# Patient Record
Sex: Female | Born: 1975 | Race: Black or African American | Hispanic: No | Marital: Single | Smoking: Never smoker
Health system: Southern US, Community
[De-identification: ages and names within clinical notes are randomized; demographics above are authoritative.]

## PROBLEM LIST (undated history)

## (undated) DIAGNOSIS — G919 Hydrocephalus, unspecified: Secondary | ICD-10-CM

## (undated) DIAGNOSIS — M6281 Muscle weakness (generalized): Secondary | ICD-10-CM

## (undated) DIAGNOSIS — G0490 Encephalitis and encephalomyelitis, unspecified: Secondary | ICD-10-CM

## (undated) DIAGNOSIS — I10 Essential (primary) hypertension: Secondary | ICD-10-CM

## (undated) DIAGNOSIS — F061 Catatonic disorder due to known physiological condition: Secondary | ICD-10-CM

## (undated) DIAGNOSIS — R569 Unspecified convulsions: Secondary | ICD-10-CM

## (undated) DIAGNOSIS — D649 Anemia, unspecified: Secondary | ICD-10-CM

## (undated) HISTORY — DX: Encephalitis and encephalomyelitis, unspecified: G04.90

## (undated) HISTORY — DX: Anemia, unspecified: D64.9

## (undated) HISTORY — DX: Catatonic disorder due to known physiological condition: F06.1

---
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
## 2004-06-25 ENCOUNTER — Emergency Department (HOSPITAL_COMMUNITY): Admission: EM | Admit: 2004-06-25 | Discharge: 2004-06-26 | Payer: Self-pay | Admitting: Emergency Medicine

## 2004-12-23 ENCOUNTER — Ambulatory Visit: Payer: Self-pay | Admitting: Obstetrics and Gynecology

## 2005-02-03 ENCOUNTER — Other Ambulatory Visit: Admission: RE | Admit: 2005-02-03 | Discharge: 2005-02-03 | Payer: Self-pay | Admitting: Obstetrics & Gynecology

## 2005-02-03 ENCOUNTER — Encounter (INDEPENDENT_AMBULATORY_CARE_PROVIDER_SITE_OTHER): Payer: Self-pay | Admitting: *Deleted

## 2005-02-03 ENCOUNTER — Ambulatory Visit: Payer: Self-pay | Admitting: Obstetrics & Gynecology

## 2005-02-24 ENCOUNTER — Ambulatory Visit: Payer: Self-pay | Admitting: Obstetrics & Gynecology

## 2005-07-31 ENCOUNTER — Encounter: Payer: Self-pay | Admitting: Family Medicine

## 2005-07-31 ENCOUNTER — Ambulatory Visit: Payer: Self-pay | Admitting: Family Medicine

## 2006-02-10 ENCOUNTER — Ambulatory Visit: Payer: Self-pay | Admitting: Family Medicine

## 2008-03-14 ENCOUNTER — Encounter: Admission: RE | Admit: 2008-03-14 | Discharge: 2008-03-14 | Payer: Self-pay | Admitting: Orthopaedic Surgery

## 2008-12-08 ENCOUNTER — Emergency Department (HOSPITAL_COMMUNITY): Admission: EM | Admit: 2008-12-08 | Discharge: 2008-12-08 | Payer: Self-pay | Admitting: Family Medicine

## 2009-06-23 ENCOUNTER — Ambulatory Visit: Payer: Self-pay | Admitting: Obstetrics and Gynecology

## 2009-06-23 ENCOUNTER — Inpatient Hospital Stay (HOSPITAL_COMMUNITY): Admission: AD | Admit: 2009-06-23 | Discharge: 2009-06-23 | Payer: Self-pay | Admitting: Obstetrics

## 2009-09-11 ENCOUNTER — Ambulatory Visit (HOSPITAL_COMMUNITY): Admission: RE | Admit: 2009-09-11 | Discharge: 2009-09-11 | Payer: Self-pay | Admitting: Obstetrics & Gynecology

## 2009-12-11 ENCOUNTER — Ambulatory Visit (HOSPITAL_COMMUNITY): Admission: RE | Admit: 2009-12-11 | Discharge: 2009-12-11 | Payer: Self-pay | Admitting: Obstetrics

## 2010-02-01 ENCOUNTER — Inpatient Hospital Stay (HOSPITAL_COMMUNITY): Admission: AD | Admit: 2010-02-01 | Discharge: 2010-02-01 | Payer: Self-pay | Admitting: Obstetrics & Gynecology

## 2010-02-01 ENCOUNTER — Inpatient Hospital Stay (HOSPITAL_COMMUNITY): Admission: AD | Admit: 2010-02-01 | Discharge: 2010-02-05 | Payer: Self-pay | Admitting: Obstetrics & Gynecology

## 2010-02-02 ENCOUNTER — Encounter: Payer: Self-pay | Admitting: Obstetrics & Gynecology

## 2010-02-06 ENCOUNTER — Inpatient Hospital Stay (HOSPITAL_COMMUNITY): Admission: AD | Admit: 2010-02-06 | Discharge: 2010-02-06 | Payer: Self-pay | Admitting: Obstetrics & Gynecology

## 2010-06-22 ENCOUNTER — Encounter: Payer: Self-pay | Admitting: Obstetrics & Gynecology

## 2010-08-14 LAB — CBC
Hemoglobin: 12.6 g/dL (ref 12.0–15.0)
MCHC: 33.8 g/dL (ref 30.0–36.0)
MCV: 99 fL (ref 78.0–100.0)
Platelets: 177 10*3/uL (ref 150–400)
Platelets: 249 10*3/uL (ref 150–400)
WBC: 16.2 10*3/uL — ABNORMAL HIGH (ref 4.0–10.5)

## 2010-08-14 LAB — URINALYSIS, ROUTINE W REFLEX MICROSCOPIC
Glucose, UA: NEGATIVE mg/dL
Protein, ur: NEGATIVE mg/dL
Specific Gravity, Urine: 1.015 (ref 1.005–1.030)
pH: 7.5 (ref 5.0–8.0)

## 2010-08-14 LAB — ABO/RH: ABO/RH(D): O POS

## 2010-08-14 LAB — URINE MICROSCOPIC-ADD ON

## 2010-08-14 LAB — RPR: RPR Ser Ql: NONREACTIVE

## 2010-08-18 LAB — URINALYSIS, ROUTINE W REFLEX MICROSCOPIC
Bilirubin Urine: NEGATIVE
Ketones, ur: NEGATIVE mg/dL
Leukocytes, UA: NEGATIVE
Nitrite: NEGATIVE
Protein, ur: NEGATIVE mg/dL
Urobilinogen, UA: 0.2 mg/dL (ref 0.0–1.0)
pH: 5.5 (ref 5.0–8.0)

## 2010-08-18 LAB — URINE MICROSCOPIC-ADD ON

## 2010-08-18 LAB — CBC: RDW: 13.3 % (ref 11.5–15.5)

## 2010-09-23 ENCOUNTER — Other Ambulatory Visit: Payer: Self-pay | Admitting: Surgery

## 2010-09-23 ENCOUNTER — Encounter (HOSPITAL_COMMUNITY): Payer: 59 | Attending: Surgery

## 2010-09-23 DIAGNOSIS — Z01812 Encounter for preprocedural laboratory examination: Secondary | ICD-10-CM | POA: Insufficient documentation

## 2010-09-23 DIAGNOSIS — M6200 Separation of muscle (nontraumatic), unspecified site: Secondary | ICD-10-CM | POA: Insufficient documentation

## 2010-09-23 DIAGNOSIS — Z01811 Encounter for preprocedural respiratory examination: Secondary | ICD-10-CM | POA: Insufficient documentation

## 2010-09-23 DIAGNOSIS — K429 Umbilical hernia without obstruction or gangrene: Secondary | ICD-10-CM | POA: Insufficient documentation

## 2010-09-23 DIAGNOSIS — Z79899 Other long term (current) drug therapy: Secondary | ICD-10-CM | POA: Insufficient documentation

## 2010-09-23 LAB — CBC
HCT: 38.5 % (ref 36.0–46.0)
Hemoglobin: 13 g/dL (ref 12.0–15.0)
MCV: 89.1 fL (ref 78.0–100.0)
Platelets: 341 10*3/uL (ref 150–400)

## 2010-09-23 LAB — SURGICAL PCR SCREEN
MRSA, PCR: NEGATIVE
Staphylococcus aureus: NEGATIVE

## 2010-09-23 LAB — HCG, SERUM, QUALITATIVE: Preg, Serum: NEGATIVE

## 2010-10-01 ENCOUNTER — Ambulatory Visit (HOSPITAL_COMMUNITY)
Admission: RE | Admit: 2010-10-01 | Discharge: 2010-10-01 | Disposition: A | Discharge status: Home / Self Care | Payer: 59 | Source: Ambulatory Visit | Attending: Surgery | Admitting: Surgery

## 2010-10-01 DIAGNOSIS — M6200 Separation of muscle (nontraumatic), unspecified site: Secondary | ICD-10-CM | POA: Insufficient documentation

## 2010-10-01 DIAGNOSIS — K439 Ventral hernia without obstruction or gangrene: Secondary | ICD-10-CM | POA: Insufficient documentation

## 2010-10-01 DIAGNOSIS — K429 Umbilical hernia without obstruction or gangrene: Secondary | ICD-10-CM | POA: Insufficient documentation

## 2010-10-14 NOTE — Op Note (Signed)
NAMESHARNEE, [name] NO.:  [id number]  MEDICAL RECORD NO.:  [id number]           PATIENT TYPE:  O  LOCATION:  DAYL                         FACILITY:  [facility]  PHYSICIAN:  Ardeth Sportsman, MD     DATE OF BIRTH:  [birth year]  DATE OF PROCEDURE:  10/01/2010 DATE OF DISCHARGE:  10/01/2010                              OPERATIVE REPORT   PREOPERATIVE DIAGNOSIS:  Periumbilical ventral hernia in a setting of diastasis recti.  POSTOPERATIVE DIAGNOSIS:  Periumbilical ventral hernia in a setting of diastasis recti.  PROCEDURE PERFORMED:  Laparoscopic plication of linea alba and primary ventral hernia repair with laparoscopic underlying mesh.  GYNECOLOGIST:  Roseanna Rainbow, M.D.  SURGEON:  Ardeth Sportsman, MD  ASSISTANT:  RN, first assistant.  ANESTHESIA: 1. General anesthesia. 2. Local anesthetic and field block. 3. On-Q continuous bupivacaine pain pump.  SPECIMENS:  None.  DRAINS:  None.  ESTIMATED BLOOD LOSS:  Minimal.  COMPLICATIONS:  None.  INDICATION:  Ms. [name] Greene is a 35 year old healthy female who was postpartum since September 2011, with significant diastasis recti and an increasing periumbilical ventral hernia.  She has had a little bit of thinning of the maceration.  She had been doing exercise, but it did not return.  Therefore, she was sent to me for surgical repair of the periumbilical ventral hernia.  Anatomy and physiology of abdominal formation was discussed. Pathophysiology of herniation with its natural history and risks were discussed.  Options were discussed.  Recommendation was made for diagnostic laparoscopy with repair of periumbilical ventral hernia.  In the face of diastasis recti, offered to try and plicate to help bring things together, although I did note the other options __________ reconstructive surgery by doing plication.  She wished to have consent with me.  Risk of recurrence, risks, benefits, and alternatives  were discussed.  Questions answered and she agreed to proceed.  OPERATIVE FINDINGS:  She had a 17 x 7 cm diastasis from the xiphoid down infraumbilically.  She had a 3 cm periumbilical ventral hernia defect. There were some omentum adherence to that region, but no severe adhesions.  There was no evidence of bowel obstruction.  DESCRIPTION OF PROCEDURE:  Informed consent was confirmed.  The patient underwent general anesthesia without any difficulty.  She received IV antibiotics just prior to incision.  She had sequential compression devices active during the case.  She had voided prior to coming to the operating room.  She was positioned supine with both arms tucked.  Her abdomen was prepped and draped in sterile fashion.  Surgical timeout confirmed our plan.  I attempted to place a 5-mm port in the left upper quadrant using optical entry technique.  However, her abdomen was little stretchy and doughy and while I get down to the peritoneum, I felt like I was having an increased pressure to get through there.  Therefore, I did a cut downon the umbilicus and placed down 5 mm port there.  I noted on inspection that the 5-mm port had gone into the preperitoneal space, but not into the peritoneal itself.  I advanced the left upper quadrant port  into the peritoneal cavity.  I did careful inspection and saw no injury to any intraabdominal structures.  I up-sized the periumbilical port to a 10-mm port.  I redirected the 5-mm port into the left lower quadrant through her prior Pfannenstiel incision.  I did inspection, noted the adhesions to the periumbilical ventral hernia and freed those off.  I measured out the defect.  I decided to try and plicate her diastasis.  I used a 0 Vicryl figure-of-8 stitch using a laparoscopic suture passer, interrupted x5 and made sure that I got fascial bites through the rectus fascia on both sides.  I desufflated the abdomen and tied these figure-of-8 stitches  down and they helped to close the diastasis down well.  I decided to reinforce this repair and also covered off the periumbilical ventral hernia using an underlay mesh.  I chose a 15 x 20 cm Parietex/Seprafilm dual-sided mesh.  I brought it into the abdomen and secured it to the anterior abdominal wall using interrupted #1 Prolene stitches x12.  This provided over 5 cm overlap across the diastasis and periumbilical ventral hernia.  I placed On-Q catheters preperitoneally under direct visualization.  I did inspection, hemostasis was good with no expanding hematoma, bowel injury or any other abnormalities.  I evacuated carbon dioxide and removed the ports. I closed the periumbilical ventral defect using interrupted Vicryl stitches since that was already exposed.  I closed the skin port site using Vicryl stitch, closed the puncture sites with transfascial stitch using Steri-Strips.  On-Q was secured.  The patient was extubated and sent to recovery room in stable condition. If her pain is adequately controlled and she is doing well, she can leave later today versus and needed to stay for longer periods if she has inadequate pain control.     Ardeth Sportsman, MD     SCG/MEDQ  D:  10/01/2010  T:  10/02/2010  Job:  [id number]  cc:   Roseanna Rainbow, M.D. Fax: [phone]  Electronically Signed by Karie Soda MD on 10/14/2010 12:06:26 PM

## 2010-10-17 NOTE — Group Therapy Note (Signed)
[name] Greene, [name] Greene.:  [id number]   MEDICAL RECORD NO.:  [id number]          PATIENT TYPE:  WOC   LOCATION:  [facility]                   FACILITY:  [facility]   PHYSICIAN:  Montey Hora, M.D.    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  She  has had one follow-up Pap in March 2007 that was normal and is here today  for her 80-month followup.  Denies any problems except for persistent vaginal  discharge.  She has been diagnosed with bacterial vaginosis in the past and  has been trying to use boric acid suppositories to keep them under control.  Continues to have occasional problems, however, with them, normally with a  watery vaginal discharge, not much itching involved.   Medicines, allergies, PMH all per previous.   PHYSICAL EXAMINATION:  VITAL SIGNS:  Temperature today is 98, pulse is 93,  blood pressure is 131/90, weight is 159.7.  GENERAL: In no acute distress.  ABDOMEN:  Soft and nontender.  GENITOURINARY:  Normal external genitalia.  Normal vaginal mucosa.  She has  a very thick, copious white discharge.  Cervix looks like it has had  previous surgery but is otherwise unremarkable.  Pap smear is sent and a wet  prep is sent.   ASSESSMENT AND PLAN:  1. History of H-G SIL with one previous normal Pap, will repeat today, and      if it is normal she can go back to yearly Pap smears.  2. Probable recurrent bacterial vaginosis.  Patient does not do well with      oral metronidazole, so desires a trial of clindamycin vaginal cream.      Prescription written for 2% cream per vagina q.h.s. x7, number quantity      sufficient with 3 refills.  Will determine followup when the Pap      returns.           ______________________________  Montey Hora,  M.D.     KR/MEDQ  D:  02/10/2006  T:  02/11/2006  Job:  [id number]

## 2010-10-17 NOTE — Group Therapy Note (Signed)
[name] Greene, [name] Greene NO.:  [id number]   MEDICAL RECORD NO.:  [id number]          PATIENT TYPE:  WOC   LOCATION:  [facility]                   FACILITY:  [facility]   PHYSICIAN:  Tinnie Gens, MD        DATE OF BIRTH:  1976/02/28   DATE OF SERVICE:                                    CLINIC NOTE   CHIEF COMPLAINT:  Followup Pap smear.   HISTORY OF PRESENT ILLNESS:  Patient is a 35 year old gravida 1, para 0-0-1-  0, who underwent LEEP in September of last year for a high-grade lesion on  an ECC.  The LEEP pathology is reviewed and only had CIN-1.  The patient for  a 29-month followup Pap.   The patient complains today of recurrent bacterial infection.  She has had a  lot recently.  She denies douching and she denies sit-down baths.   The patient also now has insurance and would like to have her birth control  refilled if possible.  She usually takes Lo Ovral and she has no  difficulties with this.   PHYSICAL EXAMINATION:  VITAL SIGNS:  Her vitals are as noted in the chart.  GENERAL: She is a well-developed, well-nourished black female in no acute  distress.  ABDOMEN:  Soft, nontender and nondistended.  GENITOURINARY:  Normal external female genitalia.  The vagina is pink and  rugated.  BUS was normal.  The uterus was small and anteverted.  The adnexa  were without mass or tenderness.  Scarring problem had previously been seen  on the cervix.   IMPRESSION:  1.  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  2.  Birth control consult.  3.  Recurrent bacterial vaginosis.   PLAN:  1.  A Pap smear today and again in 6 months.  If these are normal, she can      go back to yearly Paps.  2.  I have refilled Lo Ovral 1 p.o. daily for the next year.  3.  I have started boric acid capsules 600 mg intravaginally every other day      for the  first week and then once or twice weekly.  The patient will see      how this is doing.  If she has any further problems, she can follow up.      Otherwise, we will follow up in 6 months for a Pap.          ______________________________  Tinnie Gens, MD    TP/MEDQ  D:  07/31/2005  T:  08/01/2005  Job:  [id number]

## 2010-10-17 NOTE — Group Therapy Note (Signed)
[name] Greene, KOZUCH NO.:  [id number]   MEDICAL RECORD NO.:  [id number]          PATIENT TYPE:  WOC   LOCATION:  [facility]                   FACILITY:  [facility]   PHYSICIAN:  Elsie Lincoln, MD      DATE OF BIRTH:  Mar 30, 1976   DATE OF SERVICE:  02/03/2005                                    CLINIC NOTE   REASON FOR VISIT:  The patient underwent a LEEP for high-grade SIL on ECC  and unable to adequately visualize the transformation zone. The patient had  had questionably burning or freezing of the cervix in the past. 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  However, no acetowhite area could be visualized on the portio today. The  patient was very agitated and crying and she was soothed by the nurse.  Lidocaine 1% 10 mL with epinephrine solution was injected on the portio and  then a Fischer LEEP biopsy was attempted. However, I was having a large  problem burning at 12 o'clock with the LEEP so I started at 6 o'clock and  came to each side. I changed Fischer LEEPs twice and they were still unable  to cut through the top of the cervix. I changed to a regular U-shaped loop  and was able to get a sample of between 10 o'clock and 2 o'clock, so the  specimen is extremely fragmented and charred. However, at the end of the  procedure there was good hemostasis. I rollerballed the edges. The patient  did tolerate the procedure well. Monsel's solution was placed in the os.  There was no bleeding at the end of the procedure. The patient is to come  back in 2 weeks for results.           ______________________________  Elsie Lincoln, MD     KL/MEDQ  D:  02/03/2005  T:  02/03/2005  Job:  [id number]

## 2010-10-17 NOTE — Group Therapy Note (Signed)
NAME:  [name] Greene, [name] Greene   MEDICAL RECORD NO.:  [id number]          PATIENT TYPE:  WOC   LOCATION:  [facility]                   FACILITY:  [facility]   PHYSICIAN:  Argentina Donovan, MD        DATE OF BIRTH:  01-27-1976   DATE OF SERVICE:  12/23/2004                                    CLINIC NOTE   The patient is a 35 year old gravida 1, para 0-0-1-0 with a __________ who  [facility] an [facility] Pap [facility].  Had colposcopy which  showed mild dysplasia CIN I exocervix with a high-grade endocervical lesion.  This high-grade endocervical lesion could not be seen and therefore we are  going to treat the patient with a LEEP.  She seems to be a good candidate  for that in the clinic.  We have discussed the causes of dysplasia, the  pattern of the disease, and the procedure.  She has also watched the movie  on LEEP and we will schedule her for LEEP in the future.       PR/MEDQ  D:  12/23/2004  T:  12/24/2004  Job:  [id number]

## 2010-11-17 ENCOUNTER — Encounter (INDEPENDENT_AMBULATORY_CARE_PROVIDER_SITE_OTHER): Payer: Self-pay | Admitting: Surgery

## 2010-11-25 ENCOUNTER — Ambulatory Visit (INDEPENDENT_AMBULATORY_CARE_PROVIDER_SITE_OTHER): Payer: 59 | Admitting: Surgery

## 2010-11-25 ENCOUNTER — Encounter (INDEPENDENT_AMBULATORY_CARE_PROVIDER_SITE_OTHER): Payer: Self-pay | Admitting: Surgery

## 2010-11-25 DIAGNOSIS — M6208 Separation of muscle (nontraumatic), other site: Secondary | ICD-10-CM | POA: Insufficient documentation

## 2010-11-25 DIAGNOSIS — M6200 Separation of muscle (nontraumatic), unspecified site: Secondary | ICD-10-CM

## 2010-11-25 DIAGNOSIS — K439 Ventral hernia without obstruction or gangrene: Secondary | ICD-10-CM

## 2010-11-25 NOTE — Progress Notes (Signed)
Subjective:     Patient ID: [name] Greene, female   DOB: 03-20-1976, 35 y.o.   MRN: [id number]    There were no vitals taken for this visit.    HPI  Diagnosis:  periumbilical ventral hernia insetting of diastasis recti  Procedure performed:  plication of linea alba and primary ventral hernia repair with left underlying mesh on 09/2010  Reason for visit: Followup  The patient comes back feeling better.  She can sleep on her right side only. She does wake up with some soreness around RLQ. Her need for pain medications has markedly gone down. She's working on treadmill for 15 minutes of 4% grade. She is interested in more exercise. She is wanting to return to work.  She's had regular bowel movwements. She has occasional sweats. Chest swelling. She is wearing her binder.  Review of Systems  Gastrointestinal: Positive for abdominal pain. Negative for nausea, vomiting, diarrhea, constipation and abdominal distention.  Musculoskeletal: Negative for back pain and arthralgias.  All other systems reviewed and are negative.       Objective:   Physical Exam  Constitutional: She is oriented to person, place, and time. She appears well-developed and well-nourished. No distress.  HENT:  Head: Normocephalic.  Mouth/Throat: Oropharynx is clear and moist.  Neck: Normal range of motion. Neck supple.  Cardiovascular: Regular rhythm and intact distal pulses.   Pulmonary/Chest: Effort normal. No respiratory distress.  Abdominal: Soft. She exhibits no distension and no mass. There is no rebound and no guarding.       Mild bilateral flank soreness.  Much decreased.  Overweight w adipose.  Musculoskeletal: Normal range of motion.  Neurological: She is alert and oriented to person, place, and time.  Skin: Skin is warm and dry. She is not diaphoretic.  Psychiatric: She has a normal mood and affect. Her behavior is normal. Judgment and thought content normal.       Assessment:     7 weeks status  post plication of diastasis and repair of ventral hernia with underlay mesh    Plan:     Increase activity as tolerated. Noted when she can do a treadmill for 30 minutes then it is okay to start getting progressive activity. At this point because he's more than 6 weeks she can try more physical work. She is interested in trying to lose weight as she realizes that some of her abdominal distention she thought was really adipose tissue.  Okay to return to work. She manages standing with mld lifting.   Return to clinic p.r.n. I noted that the soreness she gradually stayed away and out of its intermittent and not constant but should help her. She felt reassured. She expressed appreciation.

## 2011-01-07 ENCOUNTER — Telehealth (INDEPENDENT_AMBULATORY_CARE_PROVIDER_SITE_OTHER): Payer: Self-pay | Admitting: Surgery

## 2011-01-07 NOTE — Telephone Encounter (Signed)
Returned pt's call [facility] notifying her that Dr Michaell Cowing said it was ok for her to get a colonic.Hulda Humphrey

## 2011-01-29 ENCOUNTER — Telehealth (INDEPENDENT_AMBULATORY_CARE_PROVIDER_SITE_OTHER): Payer: Self-pay

## 2011-01-29 NOTE — Telephone Encounter (Signed)
Called pt to notify her that I put her note in the mail today about ok to receive a colonic per Dr Michaell Cowing.Hulda Humphrey

## 2012-07-01 ENCOUNTER — Emergency Department (HOSPITAL_COMMUNITY)
Admission: EM | Admit: 2012-07-01 | Discharge: 2012-07-02 | Disposition: A | Discharge status: Skilled Nursing Facility | Payer: Medicaid Other | Attending: Emergency Medicine | Admitting: Emergency Medicine

## 2012-07-01 ENCOUNTER — Emergency Department (HOSPITAL_COMMUNITY): Payer: Medicaid Other

## 2012-07-01 DIAGNOSIS — E860 Dehydration: Secondary | ICD-10-CM | POA: Insufficient documentation

## 2012-07-01 DIAGNOSIS — G0481 Other encephalitis and encephalomyelitis: Secondary | ICD-10-CM | POA: Insufficient documentation

## 2012-07-01 DIAGNOSIS — N390 Urinary tract infection, site not specified: Secondary | ICD-10-CM | POA: Insufficient documentation

## 2012-07-01 DIAGNOSIS — R000 Tachycardia, unspecified: Secondary | ICD-10-CM | POA: Insufficient documentation

## 2012-07-01 DIAGNOSIS — R4182 Altered mental status, unspecified: Secondary | ICD-10-CM | POA: Insufficient documentation

## 2012-07-01 LAB — CBC WITH DIFFERENTIAL/PLATELET
Eosinophils Absolute: 0.1 10*3/uL (ref 0.0–0.7)
Eosinophils Relative: 1 % (ref 0–5)
Hemoglobin: 11.9 g/dL — ABNORMAL LOW (ref 12.0–15.0)
Lymphocytes Relative: 29 % (ref 12–46)
Lymphs Abs: 3.4 10*3/uL (ref 0.7–4.0)
MCH: 32 pg (ref 26.0–34.0)
Monocytes Absolute: 1 10*3/uL (ref 0.1–1.0)
Neutrophils Relative %: 62 % (ref 43–77)
Platelets: 419 10*3/uL — ABNORMAL HIGH (ref 150–400)
RDW: 15.4 % (ref 11.5–15.5)

## 2012-07-01 LAB — COMPREHENSIVE METABOLIC PANEL
Albumin: 3.5 g/dL (ref 3.5–5.2)
BUN: 16 mg/dL (ref 6–23)
CO2: 29 mEq/L (ref 19–32)
Calcium: 9.1 mg/dL (ref 8.4–10.5)
Chloride: 99 mEq/L (ref 96–112)
Creatinine, Ser: 0.9 mg/dL (ref 0.50–1.10)
GFR calc non Af Amer: 82 mL/min — ABNORMAL LOW (ref >90)
Potassium: 3.1 mEq/L — ABNORMAL LOW (ref 3.5–5.1)

## 2012-07-01 LAB — URINALYSIS, ROUTINE W REFLEX MICROSCOPIC
Nitrite: NEGATIVE
Specific Gravity, Urine: 1.023 (ref 1.005–1.030)
Urobilinogen, UA: 0.2 mg/dL (ref 0.0–1.0)
pH: 5.5 (ref 5.0–8.0)

## 2012-07-01 LAB — URINE MICROSCOPIC-ADD ON

## 2012-07-01 MED ORDER — CEPHALEXIN 500 MG PO CAPS: 500.0000 mg | ORAL_CAPSULE | Freq: Two times a day (BID) | ORAL | Status: DC

## 2012-07-01 MED ORDER — SODIUM CHLORIDE 0.9 % IV BOLUS (SEPSIS)
1000.0000 mL | Freq: Once | INTRAVENOUS | Status: AC
  Administered 2012-07-01: 1000 mL via INTRAVENOUS

## 2012-07-01 NOTE — ED Provider Notes (Signed)
History     CSN: [id number]  Arrival date & time 07/01/12  2036   First MD Initiated Contact with Patient 07/01/12 2045      Chief Complaint  Patient presents with  . Altered Mental Status    (Consider location/radiation/quality/duration/timing/severity/associated sxs/prior treatment) Patient is a 37 y.o. female presenting with shortness of breath. The history is provided by the patient.  Shortness of Breath  The current episode started today. The onset was sudden. The problem occurs frequently. The problem has been unchanged. The problem is mild. Nothing relieves the symptoms. Nothing aggravates the symptoms. Associated symptoms include shortness of breath. Pertinent negatives include no chest pain, no fever, no rhinorrhea and no wheezing. There was no intake of a foreign body. She has had intermittent steroid use. She has had prior hospitalizations. She has had prior ICU admissions. She has been fussy. Urine output has been normal. The last void occurred less than 6 hours ago. She has received no recent medical care.    No past medical history on file.  Past Surgical History  Procedure Date  . Foot surgery 1990's  . Cesarean section 02/02/10    No family history on file.  History  Substance Use Topics  . Smoking status: Never Smoker   . Smokeless tobacco: Not on file  . Alcohol Use: No    OB History    Grav Para Term Preterm Abortions TAB SAB Ect Mult Living                  Review of Systems  Constitutional: Negative for fever and fatigue.  HENT: Negative for congestion, rhinorrhea and postnasal drip.   Eyes: Negative for photophobia and visual disturbance.  Respiratory: Positive for shortness of breath. Negative for chest tightness and wheezing.   Cardiovascular: Negative for chest pain, palpitations and leg swelling.  Gastrointestinal: Negative for nausea, vomiting, abdominal pain and diarrhea.  Genitourinary: Negative for urgency, frequency and difficulty  urinating.  Musculoskeletal: Negative for back pain and arthralgias.  Skin: Negative for rash and wound.  Neurological: Negative for weakness and headaches.  Psychiatric/Behavioral: Positive for behavioral problems and confusion. Negative for agitation.    Allergies  Review of patient's allergies indicates no known allergies.  Home Medications   Current Outpatient Rx  Name  Route  Sig  Dispense  Refill  . ACETAMINOPHEN 650 MG RE SUPP   Rectal   Place 650 mg rectally every 4 (four) hours as needed. For tempeture         . VITAMIN D 1000 UNITS PO TABS   Oral   Take 1,000 Units by mouth daily.         Marland Kitchen ZOLPIDEM TARTRATE 10 MG PO TABS   Oral   Take 10 mg by mouth at bedtime as needed. For sleep         . CEPHALEXIN 500 MG PO CAPS   Oral   Take 1 capsule (500 mg total) by mouth 2 (two) times daily.   6 capsule   0     BP 115/75  Pulse 125  Temp 99.1 F (37.3 C) (Rectal)  Resp 20  SpO2 100%  Physical Exam  Nursing note and vitals reviewed. Constitutional: She appears well-developed and well-nourished. No distress.  HENT:  Head: Normocephalic and atraumatic.  Mouth/Throat: Oropharynx is clear and moist.  Eyes: EOM are normal. Pupils are equal, round, and reactive to light.  Neck: Normal range of motion. Neck supple.  Cardiovascular: Regular rhythm, normal heart sounds  and intact distal pulses.  Tachycardia present.   Pulmonary/Chest: Effort normal and breath sounds normal. She has no wheezes. She has no rales.  Abdominal: Soft. Bowel sounds are normal. She exhibits no distension. There is no tenderness. There is no rebound and no guarding.  Musculoskeletal: Normal range of motion. She exhibits no edema and no tenderness.  Lymphadenopathy:    She has no cervical adenopathy.  Neurological: She is alert. She displays normal reflexes. No cranial nerve deficit. She exhibits normal muscle tone. Coordination normal.       Eyes open but will not follow commands or  verbally respond (per nursing home this is normal when she is in a new environment)  Skin: Skin is warm. No rash noted. She is diaphoretic.  Psychiatric: She has a normal mood and affect. Her behavior is normal.    ED Course  Procedures (including critical care time)   Labs Reviewed  CBC WITH DIFFERENTIAL - Abnormal; Notable for the following:    WBC 11.7 (*)     RBC 3.72 (*)     Hemoglobin 11.9 (*)     HCT 35.0 (*)     Platelets 419 (*)     All other components within normal limits  COMPREHENSIVE METABOLIC PANEL - Abnormal; Notable for the following:    Potassium 3.1 (*)     Glucose, Bld 121 (*)     Total Bilirubin 0.2 (*)     GFR calc non Af Amer 82 (*)     All other components within normal limits  URINALYSIS, ROUTINE W REFLEX MICROSCOPIC - Abnormal; Notable for the following:    Hgb urine dipstick MODERATE (*)     Ketones, ur 15 (*)     Leukocytes, UA SMALL (*)     All other components within normal limits  URINE MICROSCOPIC-ADD ON - Abnormal; Notable for the following:    Squamous Epithelial / LPF FEW (*)     All other components within normal limits   Dg Chest Portable 1 View  07/01/2012  *RADIOLOGY REPORT*  Clinical Data: Altered mental status.  Non responsive.  PORTABLE CHEST - 1 VIEW  Comparison: None.  Findings: Thoracolumbar scoliosis.  Shallow inspiration.  Mild prominence of heart size may be due to technique.  Pulmonary vascularity is normal.  No focal airspace consolidation in the lungs.  No blunting of costophrenic angles.  No pneumothorax. Mediastinal contours appear intact.  IMPRESSION: No evidence of active pulmonary disease.   Original Report Authenticated By: Burman Nieves, M.D.      1. UTI (urinary tract infection)   2. Dehydration   3. 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 Pt was in her normal mental state and doing well for the majority of the day. Her family was visiting with her and prior to their departure, and she developed tachypnea (RR 40's), tachycardia HR 150, and hypertension. Her baseline mental status includes small conversation and ambulating but when she is in a new place she is completely non-verbal and will not follow commands. She is not oriented to person, place, or time. Does not feed herself and is incontinent. Nursing home reports that she is at her neurologic baseline but  will go in and out of catatonia at times today. On arrival to ED, pt was moaning and diaphoretic. HR 130's. Afebrile. Normotensive. Seemed to be at neurologic baseline. Given IVF and HR improved to 110.   11:09 PM Moaning stopped and patient appeared much more comfortable. Labs reveal slightly elevated WBC at 11,000. Normal renal function. Urine equivocal for infection. Will d/c with Keflex and await urine culture. Doubt serious bacterial illness at this time. Do not think she requires inpatient admission for further evaluation. Likely mild dehydration and UTI. Deemed stable for d/c back to nursing home with pcp f/u.        Johnnette Gourd, MD 07/01/12 2311

## 2012-07-01 NOTE — ED Provider Notes (Signed)
Medical screening examination/treatment/procedure(s) were conducted as a shared visit with non-physician practitioner(s) and myself.  I personally evaluated the patient during the encounter.  Seen for evaluation of agitation, possible shortness of breath. Patient quickly calmed down here without intervention and workup was negative. Discharged back to the skilled nursing facility.  Gilda Crease, MD 07/01/12 [phone]

## 2012-07-01 NOTE — ED Notes (Signed)
Per EMS- Patient from [facility] with Altered LOC. EMS originally called for hyperventilation.  Family on seen reports that patient would not let them touch her and began acting "different".  EMS also reports that patient started moaning on movement to truck.  Lung sounds are clear, no obvious issues with breathing.

## 2012-07-02 ENCOUNTER — Encounter (HOSPITAL_COMMUNITY): Payer: Self-pay

## 2012-07-02 ENCOUNTER — Emergency Department (HOSPITAL_COMMUNITY): Payer: Medicaid Other

## 2012-07-02 ENCOUNTER — Emergency Department (HOSPITAL_COMMUNITY)
Admission: EM | Admit: 2012-07-02 | Discharge: 2012-07-02 | Disposition: A | Discharge status: Home / Self Care | Payer: Medicaid Other | Attending: Emergency Medicine | Admitting: Emergency Medicine

## 2012-07-02 DIAGNOSIS — R292 Abnormal reflex: Secondary | ICD-10-CM | POA: Insufficient documentation

## 2012-07-02 DIAGNOSIS — Z79899 Other long term (current) drug therapy: Secondary | ICD-10-CM | POA: Insufficient documentation

## 2012-07-02 DIAGNOSIS — IMO0002 Reserved for concepts with insufficient information to code with codable children: Secondary | ICD-10-CM | POA: Insufficient documentation

## 2012-07-02 DIAGNOSIS — G911 Obstructive hydrocephalus: Secondary | ICD-10-CM | POA: Insufficient documentation

## 2012-07-02 DIAGNOSIS — Z931 Gastrostomy status: Secondary | ICD-10-CM | POA: Insufficient documentation

## 2012-07-02 DIAGNOSIS — I10 Essential (primary) hypertension: Secondary | ICD-10-CM | POA: Insufficient documentation

## 2012-07-02 DIAGNOSIS — M6281 Muscle weakness (generalized): Secondary | ICD-10-CM | POA: Insufficient documentation

## 2012-07-02 DIAGNOSIS — G40909 Epilepsy, unspecified, not intractable, without status epilepticus: Secondary | ICD-10-CM | POA: Insufficient documentation

## 2012-07-02 DIAGNOSIS — R569 Unspecified convulsions: Secondary | ICD-10-CM

## 2012-07-02 HISTORY — DX: Muscle weakness (generalized): M62.81

## 2012-07-02 HISTORY — DX: Hydrocephalus, unspecified: G91.9

## 2012-07-02 HISTORY — DX: Unspecified convulsions: R56.9

## 2012-07-02 HISTORY — DX: Essential (primary) hypertension: I10

## 2012-07-02 LAB — URINALYSIS, ROUTINE W REFLEX MICROSCOPIC
Nitrite: NEGATIVE
Protein, ur: NEGATIVE mg/dL
Urobilinogen, UA: 0.2 mg/dL (ref 0.0–1.0)

## 2012-07-02 LAB — BASIC METABOLIC PANEL
Calcium: 9.1 mg/dL (ref 8.4–10.5)
GFR calc Af Amer: >90 mL/min (ref >90)
GFR calc non Af Amer: >90 mL/min (ref >90)
Sodium: 139 mEq/L (ref 135–145)

## 2012-07-02 LAB — CBC WITH DIFFERENTIAL/PLATELET
Basophils Absolute: 0 10*3/uL (ref 0.0–0.1)
Eosinophils Absolute: 0 10*3/uL (ref 0.0–0.7)
Eosinophils Relative: 0 % (ref 0–5)
MCH: 30.9 pg (ref 26.0–34.0)
MCV: 93.5 fL (ref 78.0–100.0)
Neutrophils Relative %: 89 % — ABNORMAL HIGH (ref 43–77)
Platelets: 435 10*3/uL — ABNORMAL HIGH (ref 150–400)
RDW: 15.4 % (ref 11.5–15.5)
WBC: 8.7 10*3/uL (ref 4.0–10.5)

## 2012-07-02 LAB — URINE MICROSCOPIC-ADD ON

## 2012-07-02 LAB — HEPATIC FUNCTION PANEL
ALT: 7 U/L (ref 0–35)
Bilirubin, Direct: <0.1 mg/dL (ref 0.0–0.3)

## 2012-07-02 LAB — GLUCOSE, CAPILLARY: Glucose-Capillary: 116 mg/dL — ABNORMAL HIGH (ref 70–99)

## 2012-07-02 MED ORDER — LORAZEPAM 2 MG/ML IJ SOLN
INTRAMUSCULAR | Status: AC
  Filled 2012-07-02: qty 1

## 2012-07-02 MED ORDER — SODIUM CHLORIDE 0.9 % IV SOLN
1000.0000 mg | Freq: Once | INTRAVENOUS | Status: AC
  Administered 2012-07-02: 1000 mg via INTRAVENOUS
  Filled 2012-07-02: qty 10

## 2012-07-02 MED ORDER — LORAZEPAM 2 MG/ML IJ SOLN
1.0000 mg | Freq: Once | INTRAMUSCULAR | Status: AC
  Administered 2012-07-02: 1 mg via INTRAVENOUS

## 2012-07-02 MED ORDER — LEVETIRACETAM 500 MG PO TABS: 500.0000 mg | ORAL_TABLET | Freq: Two times a day (BID) | ORAL | Status: DC

## 2012-07-02 NOTE — ED Notes (Addendum)
Pt dx with encephalitis. Pt has hx encephalopathy, gen muscle weakness, abnormal gait, Cognitive communication deficit and symbolic dysfunction.  Per mom pt has hx of seizures x 1 year. Pt baseline is walking and answers questions. Pt was in a coma for 2 months recently but is getting back to her baseline.

## 2012-07-02 NOTE — ED Provider Notes (Signed)
History     CSN: [id number]  Arrival date & time 07/02/12  1357   First MD Initiated Contact with Patient 07/02/12 1522      Chief Complaint  Patient presents with  . Seizures  level 5 caveatpatient is noncommunicative. History is obtained the patient's mother and from the nurse[place]onte [place] at the skilled nursing facility. Patient  (Consider location/radiation/quality/duration/timing/severity/associated sxs/prior treatment) HPI Patient had a seizure this morning, generalized lasting approximately 5 minutes the patient has been noncommunicative since event. Mother reports the patient last talked and walked one week ago. No treatment prior to coming here. No other complaint Past Medical History  Diagnosis Date  . Hydrocephalus   . Muscle weakness (generalized)   . Seizures   . Hypertension    encephalitis  Past Surgical History  Procedure Date  . Foot surgery 1990's  . Cesarean section 02/02/10    No family history on file.  History  Substance Use Topics  . Smoking status: Never Smoker   . Smokeless tobacco: Not on file  . Alcohol Use: No    OB History    Grav Para Term Preterm Abortions TAB SAB Ect Mult Living                  Review of Systems  Unable to perform ROS: Other    Allergies  Review of patient's allergies indicates no known allergies.  Home Medications   Current Outpatient Rx  Name  Route  Sig  Dispense  Refill  . ACETAMINOPHEN 650 MG RE SUPP   Rectal   Place 650 mg rectally every 4 (four) hours as needed. For tempeture         . AMANTADINE HCL 100 MG PO CAPS   Oral   Take 100 mg by mouth 2 (two) times daily.         . AZATHIOPRINE 50 MG PO TABS   Oral   Take 50 mg by mouth daily.         Marland Kitchen VITAMIN D 1000 UNITS PO TABS   Oral   Take 1,000 Units by mouth daily.         Marland Kitchen DOCUSATE SODIUM 100 MG PO CAPS   Oral   Take 100 mg by mouth 2 (two) times daily.         Marland Kitchen ESOMEPRAZOLE MAGNESIUM 40 MG PO CPDR   Oral   Take 40  mg by mouth daily before breakfast.         . FAMOTIDINE 20 MG PO TABS   Oral   Take 20 mg by mouth.         . FERROUS GLUCONATE 325 MG PO TABS   Oral   Take 325 mg by mouth daily with breakfast.         . POLYETHYLENE GLYCOL 3350 PO PACK   Oral   Take 17 g by mouth daily.         Marland Kitchen PREDNISOLONE 15 MG/5ML PO SYRP   Oral   Take 20 mg by mouth daily.          . SENNOSIDES 8.6 MG PO TABS   Oral   Take 1 tablet by mouth daily.         Marland Kitchen ZOLPIDEM TARTRATE 10 MG PO TABS   Oral   Take 10 mg by mouth every 6 (six) hours. For sleep           BP 136/90  Pulse 100  Resp 14  SpO2 98%  Physical Exam  Nursing note and vitals reviewed. Constitutional:       Chronically ill-appearing  HENT:  Head: Normocephalic and atraumatic.  Eyes: Conjunctivae normal are normal. Pupils are equal, round, and reactive to light.  Neck: Neck supple. No tracheal deviation present. No thyromegaly present.  Cardiovascular: Normal rate and regular rhythm.   No murmur heard. Pulmonary/Chest: Effort normal and breath sounds normal.  Abdominal: Soft. Bowel sounds are normal. She exhibits no distension. There is no tenderness.       Gastrostomy tube in place  Musculoskeletal: Normal range of motion. She exhibits no edema and no tenderness.  Neurological: She is alert. She displays abnormal reflex. Coordination normal.       Withdrawal to noxious stimulus eyes open nonverbal spasticity of all 4 extremities left great toe upward going right toe downward going a phasic. tremulous  Skin: Skin is warm and dry. No rash noted.  Psychiatric: She has a normal mood and affect.    ED Course  Procedures (including critical care time)  Labs Reviewed  CBC WITH DIFFERENTIAL - Abnormal; Notable for the following:    RBC 3.82 (*)     Hemoglobin 11.8 (*)     HCT 35.7 (*)     Platelets 435 (*)     Neutrophils Relative 89 (*)     Lymphocytes Relative 10 (*)     Monocytes Relative 1 (*)     All other  components within normal limits  GLUCOSE, CAPILLARY - Abnormal; Notable for the following:    Glucose-Capillary 116 (*)     All other components within normal limits  BASIC METABOLIC PANEL   Dg Chest Portable 1 View  07/01/2012  *RADIOLOGY REPORT*  Clinical Data: Altered mental status.  Non responsive.  PORTABLE CHEST - 1 VIEW  Comparison: None.  Findings: Thoracolumbar scoliosis.  Shallow inspiration.  Mild prominence of heart size may be due to technique.  Pulmonary vascularity is normal.  No focal airspace consolidation in the lungs.  No blunting of costophrenic angles.  No pneumothorax. Mediastinal contours appear intact.  IMPRESSION: No evidence of active pulmonary disease.   Original Report Authenticated By: Burman Nieves, M.D.      No diagnosis found.  Date: 07/02/2012  Rate: 102  Rhythm: sinus tachycardia  QRS Axis: normal  Intervals: normal  ST/T Wave abnormalities: normal  Conduction Disutrbances:none  Narrative Interpretation:   Old EKG Reviewed: none available Results for orders placed during the hospital encounter of 07/02/12  CBC WITH DIFFERENTIAL      Component Value Range   WBC 8.7  4.0 - 10.5 K/uL   RBC 3.82 (*) 3.87 - 5.11 MIL/uL   Hemoglobin 11.8 (*) 12.0 - 15.0 g/dL   HCT 21.3 (*) 08.6 - 57.8 %   MCV 93.5  78.0 - 100.0 fL   MCH 30.9  26.0 - 34.0 pg   MCHC 33.1  30.0 - 36.0 g/dL   RDW 46.9  62.9 - 52.8 %   Platelets 435 (*) 150 - 400 K/uL   Neutrophils Relative 89 (*) 43 - 77 %   Neutro Abs 7.7  1.7 - 7.7 K/uL   Lymphocytes Relative 10 (*) 12 - 46 %   Lymphs Abs 0.8  0.7 - 4.0 K/uL   Monocytes Relative 1 (*) 3 - 12 %   Monocytes Absolute 0.1  0.1 - 1.0 K/uL   Eosinophils Relative 0  0 - 5 %   Eosinophils Absolute 0.0  0.0 - 0.7 K/uL  Basophils Relative 0  0 - 1 %   Basophils Absolute 0.0  0.0 - 0.1 K/uL  BASIC METABOLIC PANEL      Component Value Range   Sodium 139  135 - 145 mEq/L   Potassium 3.4 (*) 3.5 - 5.1 mEq/L   Chloride 102  96 - 112 mEq/L    CO2 28  19 - 32 mEq/L   Glucose, Bld 113 (*) 70 - 99 mg/dL   BUN 9  6 - 23 mg/dL   Creatinine, Ser 6.29  0.50 - 1.10 mg/dL   Calcium 9.1  8.4 - 52.8 mg/dL   GFR calc non Af Amer >90  >90 mL/min   GFR calc Af Amer >90  >90 mL/min  GLUCOSE, CAPILLARY      Component Value Range   Glucose-Capillary 116 (*) 70 - 99 mg/dL  URINALYSIS, ROUTINE W REFLEX MICROSCOPIC      Component Value Range   Color, Urine YELLOW  YELLOW   APPearance CLEAR  CLEAR   Specific Gravity, Urine 1.013  1.005 - 1.030   pH 7.0  5.0 - 8.0   Glucose, UA NEGATIVE  NEGATIVE mg/dL   Hgb urine dipstick MODERATE (*) NEGATIVE   Bilirubin Urine NEGATIVE  NEGATIVE   Ketones, ur TRACE (*) NEGATIVE mg/dL   Protein, ur NEGATIVE  NEGATIVE mg/dL   Urobilinogen, UA 0.2  0.0 - 1.0 mg/dL   Nitrite NEGATIVE  NEGATIVE   Leukocytes, UA MODERATE (*) NEGATIVE  HEPATIC FUNCTION PANEL      Component Value Range   Total Protein 6.7  6.0 - 8.3 g/dL   Albumin 3.5  3.5 - 5.2 g/dL   AST 14  0 - 37 U/L   ALT 7  0 - 35 U/L   Alkaline Phosphatase 72  39 - 117 U/L   Total Bilirubin 0.3  0.3 - 1.2 mg/dL   Bilirubin, Direct <4.1  0.0 - 0.3 mg/dL   Indirect Bilirubin NOT CALCULATED  0.3 - 0.9 mg/dL  URINE MICROSCOPIC-ADD ON      Component Value Range   Squamous Epithelial / LPF RARE  RARE   WBC, UA 0-2  <3 WBC/hpf   RBC / HPF 0-2  <3 RBC/hpf   Bacteria, UA RARE  RARE   Urine-Other LESS THAN 10 mL OF URINE SUBMITTED     Ct Head Wo Contrast  07/02/2012  *RADIOLOGY REPORT*  Clinical Data: Seizures.  Encephalitis.  CT HEAD WITHOUT CONTRAST  Technique:  Contiguous axial images were obtained from the base of the skull through the vertex without contrast.  Comparison: None.  Findings: Hypoattenuation adjacent to the frontal horns of the lateral ventricles and within the anterior limb of the internal capsule bilaterally is likely the result of previous ischemic injury.  No acute cortical infarct, hemorrhage, mass lesion is present.  No definite  heterotopia is identified.  The ventricles are of normal size.  No significant extra-axial fluid collection is present.  The paranasal sinuses and mastoid air cells are clear.  The osseous skull is intact.  IMPRESSION:  1.  Periventricular white matter changes likely reflect remote ischemia. 2.  No acute intracranial abnormality or focal lesion to explain seizures.   Original Report Authenticated By: Marin Roberts, M.D.    Dg Chest Portable 1 View  07/01/2012  *RADIOLOGY REPORT*  Clinical Data: Altered mental status.  Non responsive.  PORTABLE CHEST - 1 VIEW  Comparison: None.  Findings: Thoracolumbar scoliosis.  Shallow inspiration.  Mild prominence of heart size  may be due to technique.  Pulmonary vascularity is normal.  No focal airspace consolidation in the lungs.  No blunting of costophrenic angles.  No pneumothorax. Mediastinal contours appear intact.  IMPRESSION: No evidence of active pulmonary disease.   Original Report Authenticated By: Burman Nieves, M.D.     Patient medicated with Ativan 2 mg IV which stopped tremors. 8:40 PM patient is alert moves all extremities. Appears relaxed. Looks at her baseline per her mother MDM  I'm concerned for status epilepticus Neurology consult called to see patient in ED. seizure felt secondary to past encephalitis  Dr.Camilo, hospitalist neurologist evaluate patient in the emergency department feels that she suffered from seizure with prolonged postictal phase. He suggests Keppra 1 g IV load prior to discharge prescription for keppra 500 milligrams twice a day and followup with [facility].  Diagnosis seizure     Doug Sou, MD 07/02/12 2048

## 2012-07-02 NOTE — ED Notes (Signed)
Neurologist at bedside. 

## 2012-07-02 NOTE — ED Notes (Signed)
Family at bedside. 

## 2012-07-02 NOTE — ED Notes (Signed)
EMS called out to [facility] for pt with seizures. Home states it was a grand mal. Pt had hands crossed across chest and clinched up and staring.

## 2012-07-02 NOTE — Consult Note (Signed)
NEURO HOSPITALIST CONSULT NOTE    Reason for Consult:new onset seizure, non communicative for hours.   HPI:                                                                                                                                          [name] Greene is an 37 y.o. 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 According to nursing staff at that facility, she had continuous seizure activity for approximately 5 minutes. She remained non communicative in the ED for several hours but at the time of my assessment she is answering questions appropriately. Patient's family tells me that she never had seizures before and that she was a quite normal person until the diagnosis of encephalitis last year. Received 2 mg iv ativan in the ED and no further clinical seizures ever since. Offers no complains.   Past Medical History  Diagnosis Date  . Hydrocephalus   . Muscle weakness (generalized)   . Seizures   . Hypertension     Past Surgical History  Procedure Date  . Foot surgery 1990's  . Cesarean section 02/02/10    No family history on file.  Family History: no family history of epilepsy.  Social History:  reports that she has never smoked. She does not have any smokeless tobacco history on file. She reports that she does not drink alcohol. Her drug history not on file.  No Known Allergies  MEDICATIONS:                                                                                                                     I have reviewed the patient's current medications.   ROS: unable to obtain reliably.  History obtained from chart review and  patient's family.   Physical exam: Pleasant female in no apparent distress. Blood pressure 133/99, pulse 100, temperature 98.7 F (37.1 C), temperature source Rectal, resp. rate 21, SpO2 98.00%. Head: normocephalic. Neck: supple. Cardiac: no murmurs. Lungs: clear. Abdomen: soft. Extremities: symmetrical without edema.  Neurologic Examination:                                                                                                      Mental status: alert, awake, oriented to place and name. Follows 3rd order commands without difficulty. Naming and repetition are appropriate. No obvious dysphasia or dysarthria that I can appreciate. CN 2-12: pupils 4 mm bilaterally, reactive to light. No papilledema. No gaze preference. EOM full without nystagmus. Face is symmetric. Tongue is midline.  Motor: seems to be able to move all limbs symmetrically. Sensory: unreliable. DTR's: generalized hyperreflexia, more conspicuous in the left. Plantars: left upgoing, right downgoing. Coordination and gait: untested.   No results found for this basename: cbc, bmp, coags, chol, tri, ldl, hga1c    Results for orders placed during the hospital encounter of 07/02/12 (from the past 48 hour(s))  CBC WITH DIFFERENTIAL     Status: Abnormal   Collection Time   07/02/12  2:22 PM      Component Value Range Comment   WBC 8.7  4.0 - 10.5 K/uL    RBC 3.82 (*) 3.87 - 5.11 MIL/uL    Hemoglobin 11.8 (*) 12.0 - 15.0 g/dL    HCT 21.3 (*) 08.6 - 46.0 %    MCV 93.5  78.0 - 100.0 fL    MCH 30.9  26.0 - 34.0 pg    MCHC 33.1  30.0 - 36.0 g/dL    RDW 57.8  46.9 - 62.9 %    Platelets 435 (*) 150 - 400 K/uL    Neutrophils Relative 89 (*) 43 - 77 %    Neutro Abs 7.7  1.7 - 7.7 K/uL    Lymphocytes Relative 10 (*) 12 - 46 %    Lymphs Abs 0.8  0.7 - 4.0 K/uL    Monocytes Relative 1 (*) 3 - 12 %    Monocytes Absolute 0.1  0.1 - 1.0 K/uL    Eosinophils Relative 0  0 - 5 %    Eosinophils Absolute 0.0  0.0 - 0.7 K/uL     Basophils Relative 0  0 - 1 %    Basophils Absolute 0.0  0.0 - 0.1 K/uL   BASIC METABOLIC PANEL     Status: Abnormal   Collection Time   07/02/12  2:22 PM      Component Value Range Comment   Sodium 139  135 - 145 mEq/L    Potassium 3.4 (*) 3.5 - 5.1 mEq/L    Chloride 102  96 - 112 mEq/L    CO2 28  19 - 32 mEq/L    Glucose, Bld 113 (*) 70 - 99 mg/dL    BUN 9  6 - 23 mg/dL    Creatinine, Ser 5.28  0.50 -  1.10 mg/dL    Calcium 9.1  8.4 - 16.1 mg/dL    GFR calc non Af Amer >90  >90 mL/min    GFR calc Af Amer >90  >90 mL/min   HEPATIC FUNCTION PANEL     Status: Normal   Collection Time   07/02/12  2:22 PM      Component Value Range Comment   Total Protein 6.7  6.0 - 8.3 g/dL    Albumin 3.5  3.5 - 5.2 g/dL    AST 14  0 - 37 U/L    ALT 7  0 - 35 U/L    Alkaline Phosphatase 72  39 - 117 U/L    Total Bilirubin 0.3  0.3 - 1.2 mg/dL    Bilirubin, Direct <0.9  0.0 - 0.3 mg/dL REPEATED TO VERIFY   Indirect Bilirubin NOT CALCULATED  0.3 - 0.9 mg/dL   GLUCOSE, CAPILLARY     Status: Abnormal   Collection Time   07/02/12  2:46 PM      Component Value Range Comment   Glucose-Capillary 116 (*) 70 - 99 mg/dL   URINALYSIS, ROUTINE W REFLEX MICROSCOPIC     Status: Abnormal   Collection Time   07/02/12  4:46 PM      Component Value Range Comment   Color, Urine YELLOW  YELLOW    APPearance CLEAR  CLEAR    Specific Gravity, Urine 1.013  1.005 - 1.030    pH 7.0  5.0 - 8.0    Glucose, UA NEGATIVE  NEGATIVE mg/dL    Hgb urine dipstick MODERATE (*) NEGATIVE    Bilirubin Urine NEGATIVE  NEGATIVE    Ketones, ur TRACE (*) NEGATIVE mg/dL    Protein, ur NEGATIVE  NEGATIVE mg/dL    Urobilinogen, UA 0.2  0.0 - 1.0 mg/dL    Nitrite NEGATIVE  NEGATIVE    Leukocytes, UA MODERATE (*) NEGATIVE   URINE MICROSCOPIC-ADD ON     Status: Normal   Collection Time   07/02/12  4:46 PM      Component Value Range Comment   Squamous Epithelial / LPF RARE  RARE    WBC, UA 0-2  <3 WBC/hpf    RBC / HPF 0-2  <3 RBC/hpf     Bacteria, UA RARE  RARE    Urine-Other LESS THAN 10 mL OF URINE SUBMITTED   MICROSCOPIC EXAM PERFORMED ON UNCONCENTRATED URINE    Ct Head Wo Contrast  07/02/2012  *RADIOLOGY REPORT*  Clinical Data: Seizures.  Encephalitis.  CT HEAD WITHOUT CONTRAST  Technique:  Contiguous axial images were obtained from the base of the skull through the vertex without contrast.  Comparison: None.  Findings: Hypoattenuation adjacent to the frontal horns of the lateral ventricles and within the anterior limb of the internal capsule bilaterally is likely the result of previous ischemic injury.  No acute cortical infarct, hemorrhage, mass lesion is present.  No definite heterotopia is identified.  The ventricles are of normal size.  No significant extra-axial fluid collection is present.  The paranasal sinuses and mastoid air cells are clear.  The osseous skull is intact.  IMPRESSION:  1.  Periventricular white matter changes likely reflect remote ischemia. 2.  No acute intracranial abnormality or focal lesion to explain seizures.   Original Report Authenticated By: Marin Roberts, M.D.    Dg Chest Portable 1 View  07/01/2012  *RADIOLOGY REPORT*  Clinical Data: Altered mental status.  Non responsive.  PORTABLE CHEST - 1 VIEW  Comparison: None.  Findings: Thoracolumbar scoliosis.  Shallow inspiration.  Mild prominence of heart size may be due to technique.  Pulmonary vascularity is normal.  No focal airspace consolidation in the lungs.  No blunting of costophrenic angles.  No pneumothorax. Mediastinal contours appear intact.  IMPRESSION: No evidence of active pulmonary disease.   Original Report Authenticated By: Burman Nieves, M.D.      Assessment/Plan: New onset seizure in an unfortunate 37 years old female with disabling autoimmune encephalitis. She is doing much better at this moment, and I believe she most likely had a prolonged postictal state. However, her underlying encephalitis can predispose her to have  further seizures and thus will suggest loading her with 1 gram keppra now and initiating chronic anti-seizure treatment with keppra 500 mg BID. Needs outpatient neurology follow up.  Ermalene Postin  [facility] [phone]  07/02/2012, 6:43 PM

## 2012-07-02 NOTE — ED Notes (Signed)
Patient is alert with minimal response.  This is her baseline per family.  Family was given DC instructions and MD follow up.  They gave verbal understanding.  V/S stable.  She was not showing any signs of distress on DC

## 2012-07-02 NOTE — ED Notes (Signed)
[id number]<WG> Expected date:<BR> Expected time:<BR> Means of arrival:<BR> Comments:<BR> Seizures, from nursing home

## 2012-08-02 ENCOUNTER — Emergency Department (HOSPITAL_COMMUNITY)
Admission: EM | Admit: 2012-08-02 | Discharge: 2012-08-02 | Disposition: A | Discharge status: Skilled Nursing Facility | Payer: Medicaid Other | Attending: Emergency Medicine | Admitting: Emergency Medicine

## 2012-08-02 DIAGNOSIS — K9423 Gastrostomy malfunction: Secondary | ICD-10-CM | POA: Insufficient documentation

## 2012-08-02 DIAGNOSIS — IMO0002 Reserved for concepts with insufficient information to code with codable children: Secondary | ICD-10-CM | POA: Insufficient documentation

## 2012-08-02 DIAGNOSIS — Z8669 Personal history of other diseases of the nervous system and sense organs: Secondary | ICD-10-CM | POA: Insufficient documentation

## 2012-08-02 DIAGNOSIS — G40909 Epilepsy, unspecified, not intractable, without status epilepticus: Secondary | ICD-10-CM | POA: Insufficient documentation

## 2012-08-02 DIAGNOSIS — Z79899 Other long term (current) drug therapy: Secondary | ICD-10-CM | POA: Insufficient documentation

## 2012-08-02 DIAGNOSIS — I10 Essential (primary) hypertension: Secondary | ICD-10-CM | POA: Insufficient documentation

## 2012-08-02 NOTE — ED Provider Notes (Addendum)
History     CSN: [id number]  Arrival date & time 08/02/12  0012   First MD Initiated Contact with Patient 08/02/12 0016      Chief Complaint  Patient presents with  . Peg Tube Larey Seat Out     (Consider location/radiation/quality/duration/timing/severity/associated sxs/prior treatment) HPI Level V caveat: Noncommunicative. This is a 37 year old female who is the resident of a nursing facility. 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 She is not currently in a catatonic state. She was sent to the ED because her PEG tube reportedly fell out "one hour ago". There is no associated pain, bleeding or drainage at the PEG site. She has not shown any evidence of distress.   Past Medical History  Diagnosis Date  . Hydrocephalus   . Muscle weakness (generalized)   . Seizures   . Hypertension     Past Surgical History  Procedure Laterality Date  . Foot surgery  1990's  . Cesarean section  02/02/10    No family history on file.  History  Substance Use Topics  . Smoking status: Never Smoker   . Smokeless tobacco: Not on file  . Alcohol Use: No    OB History   Grav Para Term Preterm Abortions TAB SAB Ect Mult Living                  Review of Systems  Unable to perform ROS   Allergies  Review of patient's allergies indicates no known allergies.  Home Medications   Current Outpatient Rx  Name  Route  Sig  Dispense  Refill  . acetaminophen (TYLENOL) 650 MG suppository   Rectal   Place 650 mg rectally every 4 (four) hours as needed. For tempeture         . amantadine (SYMMETREL) 100 MG capsule   Oral   Take 100 mg by mouth 2 (two) times daily.         Marland Kitchen azaTHIOprine (IMURAN) 50 MG tablet   Oral   Take 50 mg by mouth daily.         . cholecalciferol (VITAMIN D) 1000 UNITS tablet   Oral   Take 1,000 Units by mouth daily.         Marland Kitchen docusate sodium (COLACE) 100 MG capsule   Oral   Take 100 mg by mouth 2 (two)  times daily.         Marland Kitchen esomeprazole (NEXIUM) 40 MG capsule   Oral   Take 40 mg by mouth daily before breakfast.         . famotidine (PEPCID) 20 MG tablet   Oral   Take 20 mg by mouth.         . ferrous gluconate (FERGON) 325 MG tablet   Oral   Take 325 mg by mouth daily with breakfast.         . levETIRAcetam (KEPPRA) 500 MG tablet   Oral   Take 1 tablet (500 mg total) by mouth every 12 (twelve) hours.   60 tablet   0   . polyethylene glycol (MIRALAX / GLYCOLAX) packet   Oral   Take 17 g by mouth daily.         . prednisoLONE (PRELONE) 15 MG/5ML syrup   Oral   Take 20 mg by mouth daily.          Marland Kitchen senna (SENOKOT) 8.6 MG tablet   Oral   Take 1 tablet by mouth daily.         Marland Kitchen  zolpidem (AMBIEN) 10 MG tablet   Oral   Take 10 mg by mouth every 6 (six) hours. For sleep           BP 132/83  Pulse 94  Temp(Src) 98 F (36.7 C) (Oral)  Resp 14  SpO2 98%  Physical Exam General: Well-developed, well-nourished female in no acute distress; appearance consistent with age of record HENT: normocephalic, atraumatic; large hairy nevus of right mandible Eyes: pupils equal round and reactive to light; extraocular muscles grossly intact Neck: supple Heart: regular rate and rhythm Lungs: clear to auscultation bilaterally Abdomen: soft; distention due to abdominal wall weakness; nontender; bowel sounds present; PEG tube site nearly completely healed closed, no granulation tissue seen, no tenderness is present, no bleeding or drainage present, stoma patent only to the lubricated wooden handle (about 2mm diameter) of a sterile cotton swab Extremities: No deformity; full range of motion; pulses normal Neurologic: Awake, alert, nonverbal; motor function intact in all extremities and symmetric; no facial droop Skin: Warm and dry    ED Course  Procedures (including critical care time)   MDM  As the patient is currently eating and drinking and is not in a catatonic  state we will discharge the patient back to her nursing facility. We will advise them that they may have her PEG tube replaced on schedule outpatient basis. Examination is consistent with the PEG tube having fallen out days or weeks ago.  Although patient is unable to express herself verbally, when asked by staff if her PEG tube had been out for several days she was able to nod in the affirmative.        Hanley Seamen, MD 08/02/12 0034  Hanley Seamen, MD 08/02/12 1610  Hanley Seamen, MD 08/02/12 [phone]

## 2012-08-02 NOTE — ED Notes (Signed)
[name] Greene from [facility] was contacted. Pt had 16 F peg tube. Pt pulled out peg tube at aprox 2300 on 08/01/12 stated by [facility] staff.  Tube sight dry no drainage. Pt does not guard area.

## 2012-08-02 NOTE — ED Notes (Signed)
Pt sent from [facility] to  ED for peg tube  To be replaced

## 2012-08-02 NOTE — Discharge Instructions (Signed)
Your PEG tube site has healed closed due to the prolonged absence of the tube. You will need to have a new patient tube placed by an interventional radiologist or gastroenterologist as an outpatient.

## 2012-09-27 ENCOUNTER — Non-Acute Institutional Stay (SKILLED_NURSING_FACILITY): Payer: 59 | Admitting: Internal Medicine

## 2012-09-27 DIAGNOSIS — R569 Unspecified convulsions: Secondary | ICD-10-CM

## 2012-09-27 DIAGNOSIS — G0490 Encephalitis and encephalomyelitis, unspecified: Secondary | ICD-10-CM

## 2012-09-27 DIAGNOSIS — F061 Catatonic disorder due to known physiological condition: Secondary | ICD-10-CM

## 2012-09-27 DIAGNOSIS — R29818 Other symptoms and signs involving the nervous system: Secondary | ICD-10-CM

## 2012-09-27 DIAGNOSIS — E870 Hyperosmolality and hypernatremia: Secondary | ICD-10-CM

## 2012-09-27 NOTE — Progress Notes (Signed)
Patient ID: [name] Greene, female   DOB: Oct 09, 1975, 37 y.o.   MRN: [id number] Chief complaint ; review of recent medication changes, question sedation.  History; this is a 37 year old woman, who we admitted to the facility in December 2013. She is asked to spent an extremely complex stay at [facility] ultimately diagnosed with limbic encephalitis. She was also seen by psychiatry for depression and apparent apparent catatonia. She came here on a complex medical regimen, which included azathioprine, prednisone at 60 mg, and Ambien 10 mg every 6 hours routinely used as "off label" treatment for catatonia. She has recently been back to see her neurologist at [facility] and he has outlined a tapering course of prednisone to 40 mg. The azathioprine has been increased. Lab work was done at the physician's office. She was also noted to have hypernatremia last checked here on March 10 at 148.   The patient is asked me some good improvement. Her PEG tube was removed in March. She is eating and drinking on her own. She is able to verbalize, talks to her mother on the phone. Staff report that she actually seems somewhat sedated with the Ambien. I don't believe she has ever had any psychiatric followup at [facility].  Past medical history this is reviewed.  Medication list also reviewed.  Physical exam. Gen. no acute distress. [name]. Respiratory clear bilaterally. Cardiac heart sounds are normal. No murmurs. Musculoskeletal she continues to have her extension at the PIP of both hands. I wonder some point if she had active synovitis, although she has not had any currently  Impression/plan #1 limbic encephalitis . This actually has made some decent improvement and she has been to see her neurologist with increasing dose of azathioprine to 150 mg a day and a taper of the prednisone initiated. #2 catatonia. Again, I really have never really witnessed this. I think we would be reasonable to  taper this down. I don't believe she has any psychiatric follow up. #3 hypernatremia. This will need to be repeated.

## 2012-09-30 ENCOUNTER — Non-Acute Institutional Stay (SKILLED_NURSING_FACILITY): Payer: Medicaid Other | Admitting: Adult Health

## 2012-09-30 DIAGNOSIS — K5900 Constipation, unspecified: Secondary | ICD-10-CM

## 2012-09-30 DIAGNOSIS — G0491 Myelitis, unspecified: Secondary | ICD-10-CM

## 2012-09-30 DIAGNOSIS — D649 Anemia, unspecified: Secondary | ICD-10-CM

## 2012-09-30 DIAGNOSIS — F061 Catatonic disorder due to known physiological condition: Secondary | ICD-10-CM

## 2012-09-30 DIAGNOSIS — F202 Catatonic schizophrenia: Secondary | ICD-10-CM

## 2012-09-30 DIAGNOSIS — R29818 Other symptoms and signs involving the nervous system: Secondary | ICD-10-CM

## 2012-09-30 DIAGNOSIS — R569 Unspecified convulsions: Secondary | ICD-10-CM

## 2012-09-30 DIAGNOSIS — G0490 Encephalitis and encephalomyelitis, unspecified: Secondary | ICD-10-CM

## 2012-10-20 ENCOUNTER — Emergency Department (HOSPITAL_COMMUNITY): Payer: Medicaid Other

## 2012-10-20 ENCOUNTER — Encounter (HOSPITAL_COMMUNITY): Payer: Self-pay | Admitting: *Deleted

## 2012-10-20 ENCOUNTER — Inpatient Hospital Stay (HOSPITAL_COMMUNITY)
Admission: EM | Admit: 2012-10-20 | Discharge: 2012-10-20 | DRG: 100 | Disposition: A | Discharge status: Home / Self Care | Payer: Medicaid Other | Attending: Internal Medicine | Admitting: Internal Medicine

## 2012-10-20 DIAGNOSIS — G0490 Encephalitis and encephalomyelitis, unspecified: Secondary | ICD-10-CM | POA: Diagnosis present

## 2012-10-20 DIAGNOSIS — R000 Tachycardia, unspecified: Secondary | ICD-10-CM | POA: Diagnosis present

## 2012-10-20 DIAGNOSIS — I10 Essential (primary) hypertension: Secondary | ICD-10-CM | POA: Diagnosis present

## 2012-10-20 DIAGNOSIS — M6281 Muscle weakness (generalized): Secondary | ICD-10-CM | POA: Diagnosis present

## 2012-10-20 DIAGNOSIS — G4089 Other seizures: Secondary | ICD-10-CM

## 2012-10-20 DIAGNOSIS — R569 Unspecified convulsions: Secondary | ICD-10-CM

## 2012-10-20 DIAGNOSIS — G40802 Other epilepsy, not intractable, without status epilepticus: Principal | ICD-10-CM | POA: Diagnosis present

## 2012-10-20 DIAGNOSIS — Z79899 Other long term (current) drug therapy: Secondary | ICD-10-CM

## 2012-10-20 DIAGNOSIS — G911 Obstructive hydrocephalus: Secondary | ICD-10-CM | POA: Diagnosis present

## 2012-10-20 DIAGNOSIS — IMO0002 Reserved for concepts with insufficient information to code with codable children: Secondary | ICD-10-CM

## 2012-10-20 DIAGNOSIS — Z931 Gastrostomy status: Secondary | ICD-10-CM

## 2012-10-20 LAB — CBC WITH DIFFERENTIAL/PLATELET
Basophils Absolute: 0 10*3/uL (ref 0.0–0.1)
HCT: 37.7 % (ref 36.0–46.0)
Lymphocytes Relative: 32 % (ref 12–46)
Monocytes Absolute: 0.6 10*3/uL (ref 0.1–1.0)
Monocytes Relative: 7 % (ref 3–12)
Neutro Abs: 4.9 10*3/uL (ref 1.7–7.7)
RBC: 4.11 MIL/uL (ref 3.87–5.11)

## 2012-10-20 LAB — COMPREHENSIVE METABOLIC PANEL
AST: 17 U/L (ref 0–37)
Alkaline Phosphatase: 55 U/L (ref 39–117)
BUN: 11 mg/dL (ref 6–23)
CO2: 24 mEq/L (ref 19–32)
Chloride: 103 mEq/L (ref 96–112)
Creatinine, Ser: 0.77 mg/dL (ref 0.50–1.10)
GFR calc non Af Amer: >90 mL/min (ref >90)
Potassium: 3.5 mEq/L (ref 3.5–5.1)
Total Bilirubin: 0.3 mg/dL (ref 0.3–1.2)

## 2012-10-20 LAB — URINALYSIS, ROUTINE W REFLEX MICROSCOPIC
Ketones, ur: NEGATIVE mg/dL
Protein, ur: NEGATIVE mg/dL
Urobilinogen, UA: 0.2 mg/dL (ref 0.0–1.0)

## 2012-10-20 LAB — PREGNANCY, URINE: Preg Test, Ur: NEGATIVE

## 2012-10-20 LAB — URINE MICROSCOPIC-ADD ON

## 2012-10-20 MED ORDER — LEVETIRACETAM 750 MG PO TABS: 750.0000 mg | ORAL_TABLET | Freq: Two times a day (BID) | ORAL | Status: AC

## 2012-10-20 MED ORDER — LORAZEPAM 2 MG/ML IJ SOLN
1.0000 mg | Freq: Once | INTRAMUSCULAR | Status: AC
  Administered 2012-10-20: 1 mg via INTRAVENOUS

## 2012-10-20 MED ORDER — LORAZEPAM 2 MG/ML IJ SOLN
INTRAMUSCULAR | Status: AC
  Administered 2012-10-20: 1 mg via INTRAVENOUS
  Filled 2012-10-20: qty 1

## 2012-10-20 NOTE — ED Notes (Signed)
Per EMS pt from [facility] [place] with c/o leg twitching x 2 episodes-lasting short amount of time. No hx of seizures. Pt not speaking, but able to follow commands, shaking head yes and no. VSS. BP 126/68, HR 120. Hx of lyme's disease Dec 2013- d/c'ed to [facility].

## 2012-10-20 NOTE — Evaluation (Signed)
   37 y/o woman with PMH significant for limbic encephalitis, catatonia , resident of [name] presents to the ED with an episode of witnessed seizure in the nursing facility . Patient also had another episode of seizure in the ED, witnessed by the nurse that was described as tonic in nature. I was called to admit her .  At the time of my evaluation , she was back to her baseline.   Filed Vitals:   10/20/12 1230  BP: 119/87  Pulse: 100  Temp:   Resp: 16    Her Imaging and lab results were reviewed- all WNL.   I discussed her case with neurology ( Dr. Cyril Mourning) who is fine with d/c the patient home, on increased dose of keppra ( current -500 mg BID) to 750 mg BID. I spoke with ED PAMarlon Pel ) who will be d/c the patient home.    Thank you.  [name] 1:28 PM 10/20/2012

## 2012-10-20 NOTE — ED Provider Notes (Addendum)
History     CSN: [id number]  Arrival date & time 10/20/12  1191   First MD Initiated Contact with Patient 10/20/12 [phone]      Chief Complaint  Patient presents with  . Tremors    (Consider location/radiation/quality/duration/timing/severity/associated sxs/prior treatment) HPI LEVEL 5 CAVEAT- none verbal/catatonic [name] Greene has a complex medical hx that is positive for limbic autoimmune encephalopathy, hypertension and more recently lymes disease. In Feb 2014 the patient had her first witnessed seizure and was placed on Keppra. Today she comes from [place] where at baseline she is in a catatonic state but will shake her head up and down to answer questions intermittently for "tremors" mainly in her legs. While evaluating the patient she had a seizure witnessed by myself and the nurse that lasted approx 20 seconds, it was upper and lower extremities where her body went ridged with mild tremor. Her pulse jumped from 115 to 145 during the seizure and then back down to 120 after the seizure activity. Ativan 1 mg ordered IV.     Past Medical History  Diagnosis Date  . Hydrocephalus   . Muscle weakness (generalized)   . Seizures   . Hypertension     Past Surgical History  Procedure Laterality Date  . Foot surgery  1990's  . Cesarean section  02/02/10    No family history on file.  History  Substance Use Topics  . Smoking status: Never Smoker   . Smokeless tobacco: Not on file  . Alcohol Use: No    OB History   Grav Para Term Preterm Abortions TAB SAB Ect Mult Living                  Review of Systems LEVEL 5 CAVEAT- none verbal/catatonic Allergies  Review of patient's allergies indicates no known allergies.  Home Medications   Current Outpatient Rx  Name  Route  Sig  Dispense  Refill  . acetaminophen (TYLENOL) 650 MG suppository   Rectal   Place 650 mg rectally every 4 (four) hours as needed. For tempeture         . azaTHIOprine (IMURAN) 50 MG tablet   Oral  Take 50 mg by mouth daily.         . cholecalciferol (VITAMIN D) 1000 UNITS tablet   Oral   Take 1,000 Units by mouth daily.         Marland Kitchen docusate sodium (COLACE) 100 MG capsule   Oral   Take 100 mg by mouth 2 (two) times daily.         . ferrous sulfate 325 (65 FE) MG tablet   Oral   Take 325 mg by mouth daily with breakfast.         . levETIRAcetam (KEPPRA) 500 MG tablet   Oral   Take 1 tablet (500 mg total) by mouth every 12 (twelve) hours.   60 tablet   0   . polyethylene glycol (MIRALAX / GLYCOLAX) packet   Oral   Take 17 g by mouth daily.         . predniSONE (DELTASONE) 50 MG tablet   Oral   Take 50 mg by mouth daily.         Marland Kitchen senna (SENOKOT) 8.6 MG tablet   Oral   Take 2 tablets by mouth daily.          Marland Kitchen zolpidem (AMBIEN) 5 MG tablet   Oral   Take 5 mg by mouth every 6 (six) hours.  BP 125/84  Pulse 111  Temp(Src) 98.5 F (36.9 C) (Rectal)  Resp 31  SpO2 100%  LMP 09/22/2012  Physical Exam  Nursing note and vitals reviewed. Constitutional: She appears well-developed and well-nourished. She appears distressed (actively having seizures).  HENT:  Head: Atraumatic.  Cardiovascular: Tachycardia present.   Pulmonary/Chest: Effort normal.  Abdominal:  PEG tube in place. No signs of pain with palpation to abdomen.  Genitourinary:  Pt wears depends.  Neurological:  Unable to evaluate neurological exam. Pt alert and post ictal appearing after seizure. None verbal    ED Course  Procedures (including critical care time)  Labs Reviewed  COMPREHENSIVE METABOLIC PANEL - Abnormal; Notable for the following:    Glucose, Bld 120 (*)    Albumin 3.4 (*)    All other components within normal limits  URINALYSIS, ROUTINE W REFLEX MICROSCOPIC - Abnormal; Notable for the following:    Hgb urine dipstick MODERATE (*)    All other components within normal limits  CBC WITH DIFFERENTIAL  PREGNANCY, URINE  URINE MICROSCOPIC-ADD ON   Ct  Head Wo Contrast  10/20/2012   *RADIOLOGY REPORT*  Clinical Data: Seizure activity  CT HEAD WITHOUT CONTRAST  Technique:  Contiguous axial images were obtained from the base of the skull through the vertex without contrast.  Comparison: 07/02/2012  Findings: There is mild low attenuation within the subcortical and periventricular white matter consistent with chronic small vessel ischemic change.  There is mild prominence of the sulci and ventricles consistent with brain atrophy.  There is no evidence for acute brain infarct, hemorrhage or mass.  The paranasal sinuses and mastoid air cells are clear.  The skull is intact.  IMPRESSION:  1.  No acute intracranial abnormalities. 2.  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   Original Report Authenticated By: Signa Kell, M.D.   Dg Chest Port 1 View  10/20/2012   *RADIOLOGY REPORT*  Clinical Data: Seizure  PORTABLE CHEST - 1 VIEW  Comparison: 07/01/2012  Findings: Heart size is normal.  There is no pleural effusion or edema.  There is no airspace consolidation identified.  Scar versus plate-like atelectasis is noted in the left midlung. There is a scoliosis deformity affecting the thoracic and lumbar spine.  IMPRESSION:  1.  Scar versus plate-like atelectasis in the left midlung.   Original Report Authenticated By: Signa Kell, M.D.     1. Tonic seizures       MDM  9:51am- 1mg  Ativan ordered. Discussed with Dr. Fredderick Phenix, labs/images initiated.    11:25am _ labs have come back over-all unremarkable. Noacute abnormalities on head CT,chest xray or labs. Spoke with neurology Dr. Cyril Mourning who recommends the patient be admitted. Spoke with family medicine resident who have agreed to admit patient. Dr. Cyril Mourning will see the patient and manage the seizure disorder. I recommend Tele bed.   Dorthula Matas, PA-C 10/20/12 1128  Neurology and internal medicine residents saw patient adn do not feel that she needs to be admitted. Dr. Cyril Mourning will have  me increase her Keppra to 750 mg BID and she can be discharged back to [facility].  Dorthula Matas, PA-C 10/20/12 1234

## 2012-10-20 NOTE — ED Notes (Signed)
Admission MD and Neurology PA here to assess pt, evaluating possible discharge home with medication adjustment.

## 2012-10-20 NOTE — Consult Note (Signed)
NEURO HOSPITALIST CONSULT NOTE    Reason for Consult: seizures.  HPI:                                                                                                                                          [name] Greene is an 37 y.o. female with a past medical history significant for hypertension, limbic autoimmune encephalitis and symptomatic seizures since February 2014, brought to [facility] ED where he had a witnessed generalized tonic seizure that ceased after receiving 1 mg ativan. She was started on keppra 500 mg BID at the time of her first seizure last February. No recent medical illnesses and apparently she is compliant with anti seizure medications. She is back to baseline now. CT brain showed no acute intracranial abnormality.   Past Medical History  Diagnosis Date  . Hydrocephalus   . Muscle weakness (generalized)   . Seizures   . Hypertension     Past Surgical History  Procedure Laterality Date  . Foot surgery  1990's  . Cesarean section  02/02/10    No family history on file.    Social History:  reports that she has never smoked. She does not have any smokeless tobacco history on file. She reports that she does not drink alcohol. Her drug history is not on file.  No Known Allergies  MEDICATIONS:                                                                                                                     I have reviewed the patient's current medications.   ROS:  History obtained from the patient and chart review.  General ROS: negative for - chills, fatigue, fever, night sweats, weight gain or weight loss Psychological ROS: negative for - hallucinations, mood swings or suicidal ideation Ophthalmic ROS: negative for - blurry vision, double vision, eye pain or loss of vision ENT ROS: negative for - epistaxis,  nasal discharge, oral lesions, sore throat, tinnitus or vertigo Allergy and Immunology ROS: negative for - hives or itchy/watery eyes Hematological and Lymphatic ROS: negative for - bleeding problems, bruising or swollen lymph nodes Endocrine ROS: negative for - galactorrhea, hair pattern changes, polydipsia/polyuria or temperature intolerance Respiratory ROS: negative for - cough, hemoptysis, shortness of breath or wheezing Cardiovascular ROS: negative for - chest pain, dyspnea on exertion, edema or irregular heartbeat Gastrointestinal ROS: negative for - abdominal pain, diarrhea, hematemesis, nausea/vomiting or stool incontinence Genito-Urinary ROS: negative for - dysuria, hematuria, incontinence or urinary frequency/urgency Musculoskeletal ROS: negative for - joint swelling Neurological ROS: as noted in HPI Dermatological ROS: negative for rash and skin lesion changes     Physical exam: pleasant female in no apparent distress. Blood pressure 125/84, pulse 111, temperature 98.5 F (36.9 C), temperature source Rectal, resp. rate 31, last menstrual period 09/22/2012, SpO2 100.00%. Head: normocephalic. Neck: supple, no bruits, no JVD. Cardiac: no murmurs. Lungs: clear. Abdomen: soft, no tender, no mass. Extremities: no edema.    Neurologic Examination:                                                                                                      Mental Status: Alert, awake, oriented to year-month-day.  Speech fluent without evidence of aphasia.  Able to follow 3 step commands without difficulty. Cranial Nerves: II: Discs flat bilaterally; Visual fields grossly normal, pupils equal, round, reactive to light and accommodation III,IV, VI: ptosis not present, extra-ocular motions intact bilaterally V,VII: smile symmetric, facial light touch sensation normal bilaterally VIII: hearing normal bilaterally IX,X: gag reflex present XI: bilateral shoulder shrug XII: midline tongue  extension Motor: Moves all limbs spontaneously and symmetrically. Sensory: Pinprick and light touch intact throughout, bilaterally Deep Tendon Reflexes: slightly overactive throughout Plantars: Right: downgoing   Left: downgoing Cerebellar: normal finger-to-nose, heel-to-shin test no tested Gait:  No tested CV: pulses palpable throughout    No results found for this basename: cbc, bmp, coags, chol, tri, ldl, hga1c    Results for orders placed during the hospital encounter of 10/20/12 (from the past 48 hour(s))  URINALYSIS, ROUTINE W REFLEX MICROSCOPIC     Status: Abnormal   Collection Time    10/20/12 10:01 AM      Result Value Range   Color, Urine YELLOW  YELLOW   APPearance CLEAR  CLEAR   Specific Gravity, Urine 1.018  1.005 - 1.030   pH 5.5  5.0 - 8.0   Glucose, UA NEGATIVE  NEGATIVE mg/dL   Hgb urine dipstick MODERATE (*) NEGATIVE   Bilirubin Urine NEGATIVE  NEGATIVE   Ketones, ur NEGATIVE  NEGATIVE mg/dL   Protein, ur NEGATIVE  NEGATIVE mg/dL   Urobilinogen, UA 0.2  0.0 - 1.0  mg/dL   Nitrite NEGATIVE  NEGATIVE   Leukocytes, UA NEGATIVE  NEGATIVE  PREGNANCY, URINE     Status: None   Collection Time    10/20/12 10:01 AM      Result Value Range   Preg Test, Ur NEGATIVE  NEGATIVE   Comment:            THE SENSITIVITY OF THIS     METHODOLOGY IS >20 mIU/mL.  URINE MICROSCOPIC-ADD ON     Status: None   Collection Time    10/20/12 10:01 AM      Result Value Range   Squamous Epithelial / LPF RARE  RARE   WBC, UA 0-2  <3 WBC/hpf   RBC / HPF 3-6  <3 RBC/hpf   Bacteria, UA RARE  RARE  CBC WITH DIFFERENTIAL     Status: None   Collection Time    10/20/12 10:13 AM      Result Value Range   WBC 8.2  4.0 - 10.5 K/uL   RBC 4.11  3.87 - 5.11 MIL/uL   Hemoglobin 12.8  12.0 - 15.0 g/dL   HCT 16.1  09.6 - 04.5 %   MCV 91.7  78.0 - 100.0 fL   MCH 31.1  26.0 - 34.0 pg   MCHC 34.0  30.0 - 36.0 g/dL   RDW 40.9  81.1 - 91.4 %   Platelets 339  150 - 400 K/uL   Neutrophils  Relative % 60  43 - 77 %   Neutro Abs 4.9  1.7 - 7.7 K/uL   Lymphocytes Relative 32  12 - 46 %   Lymphs Abs 2.6  0.7 - 4.0 K/uL   Monocytes Relative 7  3 - 12 %   Monocytes Absolute 0.6  0.1 - 1.0 K/uL   Eosinophils Relative 1  0 - 5 %   Eosinophils Absolute 0.1  0.0 - 0.7 K/uL   Basophils Relative 0  0 - 1 %   Basophils Absolute 0.0  0.0 - 0.1 K/uL  COMPREHENSIVE METABOLIC PANEL     Status: Abnormal   Collection Time    10/20/12 10:13 AM      Result Value Range   Sodium 141  135 - 145 mEq/L   Potassium 3.5  3.5 - 5.1 mEq/L   Chloride 103  96 - 112 mEq/L   CO2 24  19 - 32 mEq/L   Glucose, Bld 120 (*) 70 - 99 mg/dL   BUN 11  6 - 23 mg/dL   Creatinine, Ser 7.82  0.50 - 1.10 mg/dL   Calcium 9.0  8.4 - 95.6 mg/dL   Total Protein 6.5  6.0 - 8.3 g/dL   Albumin 3.4 (*) 3.5 - 5.2 g/dL   AST 17  0 - 37 U/L   ALT 11  0 - 35 U/L   Alkaline Phosphatase 55  39 - 117 U/L   Total Bilirubin 0.3  0.3 - 1.2 mg/dL   GFR calc non Af Amer >90  >90 mL/min   GFR calc Af Amer >90  >90 mL/min   Comment:            The eGFR has been calculated     using the CKD EPI equation.     This calculation has not been     validated in all clinical     situations.     eGFR's persistently     <90 mL/min signify     possible Chronic Kidney Disease.  Ct Head Wo Contrast  10/20/2012   *RADIOLOGY REPORT*  Clinical Data: Seizure activity  CT HEAD WITHOUT CONTRAST  Technique:  Contiguous axial images were obtained from the base of the skull through the vertex without contrast.  Comparison: 07/02/2012  Findings: There is mild low attenuation within the subcortical and periventricular white matter consistent with chronic small vessel ischemic change.  There is mild prominence of the sulci and ventricles consistent with brain atrophy.  There is no evidence for acute brain infarct, hemorrhage or mass.  The paranasal sinuses and mastoid air cells are clear.  The skull is intact.  IMPRESSION:  1.  No acute intracranial  abnormalities. 2.  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   Original Report Authenticated By: Signa Kell, M.D.   Dg Chest Port 1 View  10/20/2012   *RADIOLOGY REPORT*  Clinical Data: Seizure  PORTABLE CHEST - 1 VIEW  Comparison: 07/01/2012  Findings: Heart size is normal.  There is no pleural effusion or edema.  There is no airspace consolidation identified.  Scar versus plate-like atelectasis is noted in the left midlung. There is a scoliosis deformity affecting the thoracic and lumbar spine.  IMPRESSION:  1.  Scar versus plate-like atelectasis in the left midlung.   Original Report Authenticated By: Signa Kell, M.D.     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 Sustained a seizure while in the ED but now back to baseline. Will increase keppra to 750 mg BID. She is back to baseline and doesn't requires to stay in the hospital. Needs to follow up with neurology as outpatient in 1-2 weeks.  Wyatt Portela, MD [facility] Neuro-hospitalist [phone]  10/20/2012, 12:01 PM

## 2012-10-20 NOTE — ED Provider Notes (Signed)
Medical screening examination/treatment/procedure(s) were conducted as a shared visit with non-physician practitioner(s) and myself.  I personally evaluated the patient during the encounter   Rolan Bucco, MD 10/20/12 1517

## 2012-10-20 NOTE — ED Notes (Signed)
PA came to bedside to evaluate pt and pt became stiff, was not responding to verbal stimuli, HR jumped up to 140. No shaking noted. Pt given ativan 1 mg IVP per PA.

## 2012-10-20 NOTE — ED Notes (Signed)
Report called to Victorino Dike, Charity fundraiser at [place].

## 2012-10-20 NOTE — ED Provider Notes (Signed)
Medical screening examination/treatment/procedure(s) were conducted as a shared visit with non-physician practitioner(s) and myself.  I personally evaluated the patient during the encounter   Rolan Bucco, MD 10/20/12 1151

## 2012-12-16 ENCOUNTER — Non-Acute Institutional Stay (SKILLED_NURSING_FACILITY): Payer: Medicaid Other | Admitting: Adult Health

## 2012-12-16 DIAGNOSIS — G0490 Encephalitis and encephalomyelitis, unspecified: Secondary | ICD-10-CM

## 2012-12-16 DIAGNOSIS — F202 Catatonic schizophrenia: Secondary | ICD-10-CM

## 2012-12-16 DIAGNOSIS — R569 Unspecified convulsions: Secondary | ICD-10-CM

## 2012-12-16 DIAGNOSIS — F061 Catatonic disorder due to known physiological condition: Secondary | ICD-10-CM

## 2012-12-16 DIAGNOSIS — K5900 Constipation, unspecified: Secondary | ICD-10-CM

## 2012-12-16 DIAGNOSIS — R29818 Other symptoms and signs involving the nervous system: Secondary | ICD-10-CM

## 2012-12-16 DIAGNOSIS — D649 Anemia, unspecified: Secondary | ICD-10-CM

## 2012-12-30 ENCOUNTER — Other Ambulatory Visit (HOSPITAL_COMMUNITY): Payer: Self-pay | Admitting: Internal Medicine

## 2012-12-30 DIAGNOSIS — T17320D Food in larynx causing asphyxiation, subsequent encounter: Secondary | ICD-10-CM

## 2013-01-04 ENCOUNTER — Ambulatory Visit (HOSPITAL_COMMUNITY)
Admission: RE | Admit: 2013-01-04 | Discharge: 2013-01-04 | Disposition: A | Discharge status: Home / Self Care | Payer: Medicaid Other | Source: Ambulatory Visit | Attending: Internal Medicine | Admitting: Internal Medicine

## 2013-01-04 DIAGNOSIS — R6889 Other general symptoms and signs: Secondary | ICD-10-CM | POA: Insufficient documentation

## 2013-01-04 DIAGNOSIS — T17320D Food in larynx causing asphyxiation, subsequent encounter: Secondary | ICD-10-CM

## 2013-01-04 DIAGNOSIS — R1110 Vomiting, unspecified: Secondary | ICD-10-CM | POA: Insufficient documentation

## 2013-01-04 NOTE — Procedures (Signed)
Objective Swallowing Evaluation: Modified Barium Swallowing Study  Patient Details  Name: [name] Greene MRN: [id number] Date of Birth: July 16, 1975  Today's Date: 01/04/2013 Time: 1100-1130 SLP Time Calculation (min): 30 min  Past Medical History:  Past Medical History  Diagnosis Date  . Hydrocephalus   . Muscle weakness (generalized)   . Seizures   . Hypertension    Past Surgical History:  Past Surgical History  Procedure Laterality Date  . Foot surgery  1990's  . Cesarean section  02/02/10   HPI:  [name] Greene is an 37 y.o. female with a past medical history significant for hypertension, limbic autoimmune encephalitis and symptomatic seizures since February 2014,  Referred for OPMBS secondary to occasional choking episodes during meals requiring suctioning as well as regurgitation of meals.       Assessment / Plan / Recommendation Clinical Impression  Dysphagia Diagnosis: Within Functional Limits  Clinical impression: Pt's oropharyngeal swallow function was observed to be WNL; esophageal screen revealed normal passage of barium through column and LES.  Discussed with pt and her mother the circumstances during which pt choked/regurgitated.  We reviewed video in real time, discussing the normal anatomy and physiology evident from exam.  No etiology of incidents could be identified.     Treatment Recommendation  No treatment recommended at this time    Diet Recommendation Regular;Thin liquid   Liquid Administration via: Cup;Straw Medication Administration: Whole meds with liquid    Other  Recommendations     Follow Up Recommendations  None           SLP Swallow Goals     General  Type of Study: Modified Barium Swallowing Study Reason for Referral: Objectively evaluate swallowing function Diet Prior to this Study: Regular;Thin liquids Temperature Spikes Noted: No Respiratory Status: Room air History of Recent Intubation: No Behavior/Cognition: Alert;Requires  cueing Oral Cavity - Dentition: Adequate natural dentition Oral Motor / Sensory Function: Within functional limits.  Pt with lips tightened around dentition initially; difficulty following commands to release.  No communication initially. Required manual retraction of lips from teeth, cues to sigh/phonate, repeat single words, then pt began to speak fluently in response to questions.     Reason for Referral Objectively evaluate swallowing function   Oral Phase Oral Preparation/Oral Phase Oral Phase: WFL   Pharyngeal Phase Pharyngeal Phase Pharyngeal Phase: Within functional limits  Cervical Esophageal Phase    GO    Cervical Esophageal Phase Cervical Esophageal Phase: [facility]    Functional Assessment Tool Used: clinical judgement Functional Limitations: Swallowing Swallow Current Status (W0981): 0 percent impaired, limited or restricted Swallow Goal Status (X9147): 0 percent impaired, limited or restricted Swallow Discharge Status (W2956): 0 percent impaired, limited or restricted    Blenda Mounts Laurice 01/04/2013, 2:25 PM

## 2013-02-16 ENCOUNTER — Encounter: Payer: Self-pay | Admitting: Adult Health

## 2013-02-16 DIAGNOSIS — K5900 Constipation, unspecified: Secondary | ICD-10-CM | POA: Insufficient documentation

## 2013-02-16 DIAGNOSIS — F061 Catatonic disorder due to known physiological condition: Secondary | ICD-10-CM

## 2013-02-16 DIAGNOSIS — G0490 Encephalitis and encephalomyelitis, unspecified: Secondary | ICD-10-CM | POA: Insufficient documentation

## 2013-02-16 DIAGNOSIS — R569 Unspecified convulsions: Secondary | ICD-10-CM | POA: Insufficient documentation

## 2013-02-16 DIAGNOSIS — D649 Anemia, unspecified: Secondary | ICD-10-CM

## 2013-02-16 HISTORY — DX: Catatonic disorder due to known physiological condition: F06.1

## 2013-02-16 HISTORY — DX: Anemia, unspecified: D64.9

## 2013-02-16 HISTORY — DX: Encephalitis and encephalomyelitis, unspecified: G04.90

## 2013-02-16 NOTE — Assessment & Plan Note (Signed)
No reports of seizure activity present will continue keppra 500 mg twice daily and will monitor her status

## 2013-02-16 NOTE — Assessment & Plan Note (Signed)
She is presently stable is presently on a long term tapering dose of prednisone and is on 40 mg daily will continue this current dose; will continue her imuran at 150 mg daily will continue to monitor her status. She will verbalize at times

## 2013-02-16 NOTE — Assessment & Plan Note (Signed)
Is stable will continue iron daily  

## 2013-02-16 NOTE — Assessment & Plan Note (Signed)
Will continue colace twice daily and senna 2 tabs daily

## 2013-02-16 NOTE — Assessment & Plan Note (Signed)
She is awake and aware of her surroundings will continue her ambien 5 mg every 6 hours and will continue to monitor her status

## 2013-02-16 NOTE — Progress Notes (Signed)
Patient ID: [name] Greene, female   DOB: 01-Nov-1975, 37 y.o.   MRN: [id number]  [name]  No Known Allergies   Chief Complaint  Patient presents with  . Medical Managment of Chronic Issues    HPI: She is being seen for hte management of her chronic illnesses. She is unable to fully participate in the hpi or ros. She will verbalize at times and will talk with her family members. She is no longer peg tube dependent. There are no concerns being voiced by the nursing staff.   Past Medical History  Diagnosis Date  . Hydrocephalus   . Muscle weakness (generalized)   . Seizures   . Hypertension     Past Surgical History  Procedure Laterality Date  . Foot surgery  1990's  . Cesarean section  02/02/10    VITAL SIGNS BP 134/86  Pulse 100  Ht 5\' 10"  (1.778 m)  Wt 161 lb (73.029 kg)  BMI 23.1 kg/m2   Patient's Medications  New Prescriptions   No medications on file  Previous Medications   ACETAMINOPHEN (TYLENOL) 650 MG SUPPOSITORY    Place 650 mg rectally every 4 (four) hours as needed. For tempeture   AZATHIOPRINE (IMURAN) 50 MG TABLET    Take 50 mg by mouth daily.   CHOLECALCIFEROL (VITAMIN D) 1000 UNITS TABLET    Take 1,000 Units by mouth daily.   DOCUSATE SODIUM (COLACE) 100 MG CAPSULE    Take 100 mg by mouth 2 (two) times daily.   FERROUS SULFATE 325 (65 FE) MG TABLET    Take 325 mg by mouth daily with breakfast.   LEVETIRACETAM (KEPPRA) 750 MG TABLET    Take 1 tablet (750 mg total) by mouth every 12 (twelve) hours.   POLYETHYLENE GLYCOL (MIRALAX / GLYCOLAX) PACKET    Take 17 g by mouth daily.   PREDNISONE (DELTASONE) 50 MG TABLET    Take 40 mg by mouth daily.    SENNA (SENOKOT) 8.6 MG TABLET    Take 2 tablets by mouth daily.    ZOLPIDEM (AMBIEN) 5 MG TABLET    Take 5 mg by mouth every 6 (six) hours.  Modified Medications   No medications on file  Discontinued Medications   No medications on file    SIGNIFICANT DIAGNOSTIC EXAMS    LABS REVIEWED:   08-03-12: wbc  8.3; hgb 11.1; hct 33.9; mcv 91.9; plt 333; glucose 78; bun 13; creat 0.87; k+3.4; na++143 Liver normal albumin 3.5  08-08-12; glucose 85; bun 11; creat 0.89; k+3.5; na++ 148  Review of Systems  Unable to perform ROS   Physical Exam  Constitutional: She appears well-developed and well-nourished.  Neck: Neck supple. No JVD present.  Cardiovascular: Normal rate, regular rhythm and intact distal pulses.   Respiratory: Effort normal and breath sounds normal. No respiratory distress.  GI: Soft. Bowel sounds are normal. She exhibits no distension. There is no tenderness.  Musculoskeletal: Normal range of motion.  Neurological: She is alert.  Skin: Skin is warm and dry.      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

## 2013-02-21 ENCOUNTER — Non-Acute Institutional Stay (SKILLED_NURSING_FACILITY): Payer: Medicaid Other | Admitting: Internal Medicine

## 2013-02-21 DIAGNOSIS — F202 Catatonic schizophrenia: Secondary | ICD-10-CM

## 2013-02-21 DIAGNOSIS — F061 Catatonic disorder due to known physiological condition: Secondary | ICD-10-CM

## 2013-02-21 DIAGNOSIS — R29818 Other symptoms and signs involving the nervous system: Secondary | ICD-10-CM

## 2013-02-21 DIAGNOSIS — G0490 Encephalitis and encephalomyelitis, unspecified: Secondary | ICD-10-CM

## 2013-02-21 DIAGNOSIS — R569 Unspecified convulsions: Secondary | ICD-10-CM

## 2013-02-21 NOTE — Progress Notes (Signed)
Patient ID: [name] Greene, female   DOB: [birth year], 37 y.o.   MRN: [id number]  Chief complaint ; review of recent medication changes, question sedation.  History; this is a 37 year old woman, who we admitted to the facility in December 2013. She was post an extremely complex stay at [facility] ultimately diagnosed with limbic encephalitis. She was also seen by psychiatry for depression and apparent apparent catatonia. She came here on a complex medical regimen, which included azathioprine, prednisone at 60 mg, and Ambien 10 mg every 6 hours routinely used as "off label" treatment for catatonia. She has recently been back to see her neurologist at [facility] and he has outlined a tapering course of prednisone to 40 mg. The azathioprine has been increased to 150 mg daily.. Lab work was done at the physician's office. She was also noted to have hypernatremia last checked here on March 10 at 148.   The patient is asked me some good improvement. Her PEG tube was removed in March. She is eating and drinking on her own. She is able to verbalize, talks to her mother on the phone. Staff report that she actually seems somewhat sedated with the Ambien. I don't believe she has ever had any psychiatric followup at [facility].   Medications; ferrous sulfate 325 daily, Senokot 2 tablets each bedtime, vitamin D 3 1000 units daily, azathioprine 150 mg daily, prednisone 40 mg daily, Colace 100 twice a day, Keppra 750 twice daily, Ambien 5 mg every 6 hours routinely for due to the history of catatonia  Lab work from July 2 showed a white count of 11.2 hemoglobin of 12.4 there's been no recent blood  Past medical history this is reviewed.  Physical exam. Gen. no acute distress. [name]. HEENT; mouth looks normal Respiratory clear bilaterally. Cardiac heart sounds are normal. No murmurs. Musculoskeletal she continues to have her extension at the PIP of both hands. I wonder some point if she had  active synovitis, although she has not had any currently Mental status: She would not verbalize with me today although she does write answers appropriately. There are some cognitive deficits most likely. She is made good functional improvements she is able to stand walk a bit although her balance is poor.  Impression/plan #1 limbic encephalitis . This actually has made some decent improvement and she has been to see her neurologist with increasing dose of azathioprine to 150 mg a day and a taper of the prednisone initiated. #2 catatonia. Again, I really have never really witnessed this. I think we would be reasonable to taper this down. I don't believe she has any psychiatric follow up. #3 hypernatremia. This will need to be repeated.   One point I believe the plan was to repeat her MRI. Looking back on the records would appear that an MRI of the brain was ordered in April. I don't believe this was ever done. Norco 5 over believe she followed up with a doctor Cooney (sp??) She could probably taper the Ambien and I'm going to start that. I am not really comfortable with the azathioprine prednisone combination however I know little about her use in this disease and I would be much more comfortable with followup from her physician at [facility]

## 2013-02-24 ENCOUNTER — Non-Acute Institutional Stay (SKILLED_NURSING_FACILITY): Payer: Medicaid Other | Admitting: Adult Health

## 2013-02-24 DIAGNOSIS — F202 Catatonic schizophrenia: Secondary | ICD-10-CM

## 2013-02-24 DIAGNOSIS — K5900 Constipation, unspecified: Secondary | ICD-10-CM

## 2013-02-24 DIAGNOSIS — F061 Catatonic disorder due to known physiological condition: Secondary | ICD-10-CM

## 2013-02-24 DIAGNOSIS — G0490 Encephalitis and encephalomyelitis, unspecified: Secondary | ICD-10-CM

## 2013-02-24 DIAGNOSIS — R29818 Other symptoms and signs involving the nervous system: Secondary | ICD-10-CM

## 2013-02-24 DIAGNOSIS — D649 Anemia, unspecified: Secondary | ICD-10-CM

## 2013-02-24 DIAGNOSIS — R569 Unspecified convulsions: Secondary | ICD-10-CM

## 2013-03-22 ENCOUNTER — Encounter: Payer: Self-pay | Admitting: Adult Health

## 2013-03-22 NOTE — Progress Notes (Signed)
Patient ID: [name] Greene, female   DOB: [birth year], 37 y.o.   MRN: [id number] [name]  No Known Allergies  Chief Complaint  Patient presents with  . Medical Managment of Chronic Issues     HPI: She is being seen for hte management of her chronic illnesses. She is unable to fully participate in the hpi or ros. She will verbalize at times and will talk with her family members. She is no longer peg tube dependent. There are no concerns being voiced by the nursing staff.   Past Medical History  Diagnosis Date  . Hydrocephalus   . Muscle weakness (generalized)   . Seizures   . Hypertension   . Limbic encephalitis   . Anemia, unspecified   . Catatonia       Past Surgical History  Procedure Laterality Date  . Foot surgery  1990's  . Cesarean section  02/02/10   Filed Vitals:   12/16/12 1056  BP: 130/80  Pulse: 60  Height: 5\' 10"  (1.778 m)  Weight: 162 lb (73.483 kg)     Patient's Medications  New Prescriptions   No medications on file  Previous Medications   ACETAMINOPHEN (TYLENOL) 650 MG SUPPOSITORY    Place 650 mg rectally every 4 (four) hours as needed. For tempeture   AZATHIOPRINE (IMURAN) 50 MG TABLET    Take 50 mg by mouth daily.   CHOLECALCIFEROL (VITAMIN D) 1000 UNITS TABLET    Take 1,000 Units by mouth daily.   DOCUSATE SODIUM (COLACE) 100 MG CAPSULE    Take 100 mg by mouth 2 (two) times daily.   FERROUS SULFATE 325 (65 FE) MG TABLET    Take 325 mg by mouth daily with breakfast.   LEVETIRACETAM (KEPPRA) 750 MG TABLET    Take 1 tablet (750 mg total) by mouth every 12 (twelve) hours.   POLYETHYLENE GLYCOL (MIRALAX / GLYCOLAX) PACKET    Take 17 g by three times daily    PREDNISONE (DELTASONE) 50 MG TABLET    Take 40 mg by mouth daily.    SENNA (SENOKOT) 8.6 MG TABLET    Take 2 tablets by mouth daily.    ZOLPIDEM (AMBIEN) 5 MG TABLET    Take 5 mg by mouth every 6 (six) hours.  Modified Medications   No medications on file  Discontinued Medications   No  medications on file    SIGNIFICANT DIAGNOSTIC EXAMS    LABS REVIEWED:   08-03-12: wbc 8.3; hgb 11.1; hct 33.9; mcv 91.9; plt 333; glucose 78; bun 13; creat 0.87; k+3.4; na++143 Liver normal albumin 3.5  08-08-12; glucose 85; bun 11; creat 0.89; k+3.5; na++ 148  Review of Systems  Unable to perform ROS   Physical Exam  Constitutional: She appears well-developed and well-nourished.  Neck: Neck supple. No JVD present.  Cardiovascular: Normal rate, regular rhythm and intact distal pulses.   Respiratory: Effort normal and breath sounds normal. No respiratory distress.  GI: Soft. Bowel sounds are normal. She exhibits no distension. There is no tenderness.  Musculoskeletal: Normal range of motion. is out of bed to wheelchair; does ambulate with staff.  Neurological: She is alert.  Skin: Skin is warm and dry.      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 She will verbalize at times  Catatonia She is awake and aware of her surroundings will continue her ambien 5 mg every 6 hours and will continue to monitor her status   Seizures No reports of seizure activity present will continue keppra 750 mg twice daily and will monitor her status   Anemia, unspecified Is stable will continue iron daily   Constipation Will continue colace twice daily senna  daily and miralax three times daily

## 2013-03-24 ENCOUNTER — Non-Acute Institutional Stay (SKILLED_NURSING_FACILITY): Payer: Medicaid Other | Admitting: Nurse Practitioner

## 2013-03-24 DIAGNOSIS — K5900 Constipation, unspecified: Secondary | ICD-10-CM

## 2013-03-24 DIAGNOSIS — R569 Unspecified convulsions: Secondary | ICD-10-CM

## 2013-03-24 DIAGNOSIS — G0490 Encephalitis and encephalomyelitis, unspecified: Secondary | ICD-10-CM

## 2013-03-24 DIAGNOSIS — D649 Anemia, unspecified: Secondary | ICD-10-CM

## 2013-03-24 NOTE — Progress Notes (Signed)
Patient ID: [name] Greene, female   DOB: 05/11/1976, 37 y.o.   MRN: [id number]   PCP: Terald Sleeper, MD   No Known Allergies  Chief Complaint  Patient presents with  . Medical Managment of Chronic Issues    HPI:  37 year old female with pmh of limbic encephalitis, catatonia, constipation, anemia, seizures; pt is long term resident of [facility]. She is being seen for the management of her chronic illnesses. Pt follows at [facility] due to limbic encephalitis.  There are no concerns being voiced by the nursing staff at this time. She is unable to fully participate in the hpi or ros. She continues to do well.    Review of Systems:  Unable to obtain  Past Medical History  Diagnosis Date  . Hydrocephalus   . Muscle weakness (generalized)   . Seizures   . Hypertension   . Limbic encephalitis 02/16/2013  . Anemia, unspecified 02/16/2013  . Catatonia 02/16/2013   Past Surgical History  Procedure Laterality Date  . Foot surgery  1990's  . Cesarean section  02/02/10   Social History:   reports that she has never smoked. She does not have any smokeless tobacco history on file. She reports that she does not drink alcohol. Her drug history is not on file.  No family history on file.  Medications: Patient's Medications  New Prescriptions   No medications on file  Previous Medications   ACETAMINOPHEN (TYLENOL) 650 MG SUPPOSITORY    Place 650 mg rectally every 4 (four) hours as needed. For tempeture   AZATHIOPRINE (IMURAN) 50 MG TABLET    Take 150 mg by mouth daily.    CHOLECALCIFEROL (VITAMIN D) 1000 UNITS TABLET    Take 1,000 Units by mouth daily.   DOCUSATE SODIUM (COLACE) 100 MG CAPSULE    Take 100 mg by mouth 2 (two) times daily.   FERROUS SULFATE 325 (65 FE) MG TABLET    Take 325 mg by mouth daily with breakfast.   LEVETIRACETAM (KEPPRA) 750 MG TABLET    Take 1 tablet (750 mg total) by mouth every 12 (twelve) hours.   POLYETHYLENE GLYCOL (MIRALAX / GLYCOLAX) PACKET    Take  17 g by mouth 3 (three) times daily.    PREDNISONE (DELTASONE) 50 MG TABLET    Take 40 mg by mouth daily.    SENNA (SENOKOT) 8.6 MG TABLET    Take 2 tablets by mouth daily.   Modified Medications   No medications on file  Discontinued Medications   ZOLPIDEM (AMBIEN) 5 MG TABLET    Take 5 mg by mouth every 8 (eight) hours.      Physical Exam: Physical Exam  Constitutional: She is well-developed, well-nourished, and in no distress. No distress.  Cardiovascular: Normal rate, regular rhythm and normal heart sounds.   Pulmonary/Chest: Effort normal and breath sounds normal.  Abdominal: Soft. Bowel sounds are normal. She exhibits no distension. There is no tenderness.  Musculoskeletal: She exhibits no edema and no tenderness.  Neurological: She is alert.  Skin: Skin is warm and dry. She is not diaphoretic.    Filed Vitals:   03/24/13 1559  BP: 120/70  Pulse: 98  Temp: 97.9 F (36.6 C)  Resp: 20  Weight: 163 lb (73.936 kg)      Labs reviewed: Basic Metabolic Panel:  Recent Labs  40/98/11 2106 07/02/12 1422 10/20/12 1013  NA 141 139 141  K 3.1* 3.4* 3.5  CL 99 102 103  CO2 29 28 24  GLUCOSE 121* 113* 120*  BUN 16 9 11   CREATININE 0.90 0.69 0.77  CALCIUM 9.1 9.1 9.0   Liver Function Tests:  Recent Labs  07/01/12 2106 07/02/12 1422 10/20/12 1013  AST 12 14 17   ALT 7 7 11   ALKPHOS 66 72 55  BILITOT 0.2* 0.3 0.3  PROT 6.6 6.7 6.5  ALBUMIN 3.5 3.5 3.4*   No results found for this basename: LIPASE, AMYLASE,  in the last 8760 hours No results found for this basename: AMMONIA,  in the last 8760 hours CBC:  Recent Labs  07/01/12 2106 07/02/12 1422 10/20/12 1013  WBC 11.7* 8.7 8.2  NEUTROABS 7.2 7.7 4.9  HGB 11.9* 11.8* 12.8  HCT 35.0* 35.7* 37.7  MCV 94.1 93.5 91.7  PLT 419* 435* 339   Cardiac Enzymes: No results found for this basename: CKTOTAL, CKMB, CKMBINDEX, TROPONINI,  in the last 8760 hours BNP: No components found with this basename: POCBNP,   CBG:  Recent Labs  07/02/12 1446  GLUCAP 116*    02/23/13  Sodium 143, potassium 3.8, glucose 97, bun 17, Cr 0.96 02/22/13: wbc 9/9, rbc 3.71, hgb 11.7, hct 34.7, rdw 15.3, plts 301 Sodium 142, potassium 3.4, glucose 78, BUN 10, Cr 0.88  Assessment/Plan 1. Limbic encephalitis Stable at this time on prednisone, imuran and ambien  2. Constipation Stable on colace and miralax  3. Seizures No noted seizures since last visit; conts on keppra  4. Anemia, unspecified conts on iron; cbc stable from 9/14  5. Hypokalemia Will follow up bmp

## 2013-03-27 ENCOUNTER — Encounter: Payer: Self-pay | Admitting: Adult Health

## 2013-03-27 NOTE — Progress Notes (Signed)
Patient ID: [name] Greene, female   DOB: [birth year], 37 y.o.   MRN: [id number]  [name]  No Known Allergies   Chief Complaint  Patient presents with  . Medical Managment of Chronic Issues    HPI: She is being seen for the management of her chronic illnesses. Her Remus Loffler has been lowered to every 8 hours; she is doing well with this lowered dose as this time. There are no concerns being voiced by the nursing staff at this time. She is unable to fully participate in the hpi or ros. She continues to do well.   Past Medical History  Diagnosis Date  . Hydrocephalus   . Muscle weakness (generalized)   . Seizures   . Hypertension   . Limbic encephalitis 02/16/2013  . Anemia, unspecified 02/16/2013  . Catatonia 02/16/2013    Past Surgical History  Procedure Laterality Date  . Foot surgery  1990's  . Cesarean section  02/02/10    VITAL SIGNS BP 122/70  Pulse 70  Ht 5\' 10"  (1.778 m)  Wt 161 lb (73.029 kg)  BMI 23.1 kg/m2   Patient's Medications  New Prescriptions   No medications on file  Previous Medications   ACETAMINOPHEN (TYLENOL) 650 MG SUPPOSITORY    Place 650 mg rectally every 4 (four) hours as needed. For tempeture   AZATHIOPRINE (IMURAN) 50 MG TABLET    Take 150 mg by mouth daily.    CHOLECALCIFEROL (VITAMIN D) 1000 UNITS TABLET    Take 1,000 Units by mouth daily.   DOCUSATE SODIUM (COLACE) 100 MG CAPSULE    Take 100 mg by mouth 2 (two) times daily.   FERROUS SULFATE 325 (65 FE) MG TABLET    Take 325 mg by mouth daily with breakfast.   LEVETIRACETAM (KEPPRA) 750 MG TABLET    Take 1 tablet (750 mg total) by mouth every 12 (twelve) hours.   POLYETHYLENE GLYCOL (MIRALAX / GLYCOLAX) PACKET    Take 17 g by mouth 3 (three) times daily.    PREDNISONE (DELTASONE) 50 MG TABLET    Take 40 mg by mouth daily.    ambien 5 mg tab   take every 8 hours    SENNA (SENOKOT) 8.6 MG TABLET    Take 2 tablets by mouth daily.   Modified Medications   No medications on file  Discontinued  Medications   No medications on file    SIGNIFICANT DIAGNOSTIC EXAMS     LABS REVIEWED:   08-03-12: wbc 8.3; hgb 11.1; hct 33.9; mcv 91.9; plt 333; glucose 78; bun 13; creat 0.87; k+3.4; na++143 Liver normal albumin 3.5  08-08-12; glucose 85; bun 11; creat 0.89; k+3.5; na++ 148 11-30-12: wbc 11.;2 hgb 12.4; hct 36.6; mcv 91.5 plt 377 02-22-13: wbc 9.9;hgb 11.7; hct 34.7 ;mcv93.5; plt 301; glucose 78;bun 10; creat 0.88; k+3.4; na++143; liver normal albumin 3.6 02-23-13: glucose 97; bun 17; creat 0.96; k+3.8; na++ 143     Review of Systems  Unable to perform ROS   Physical Exam  Constitutional: She appears well-developed and well-nourished.  Neck: Neck supple. No JVD present.  Cardiovascular: Normal rate, regular rhythm and intact distal pulses.   Respiratory: Effort normal and breath sounds normal. No respiratory distress.  GI: Soft. Bowel sounds are normal. She exhibits no distension. There is no tenderness.  Musculoskeletal: Normal range of motion. is out of bed to wheelchair; does ambulate with staff.  Neurological: She is alert.  Skin: Skin is warm and dry.      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 She will verbalize at times  Catatonia She is awake and aware of her surroundings will continue her ambien 5 mg every 8 hours and will continue to monitor her status   Seizures No reports of seizure activity present will continue keppra 750 mg twice daily and will monitor her status   Anemia, unspecified Is stable will continue iron daily   Constipation Will continue colace twice daily senna  daily and miralax three times daily

## 2013-04-04 ENCOUNTER — Non-Acute Institutional Stay (SKILLED_NURSING_FACILITY): Payer: Medicaid Other | Admitting: Internal Medicine

## 2013-04-04 DIAGNOSIS — F061 Catatonic disorder due to known physiological condition: Secondary | ICD-10-CM

## 2013-04-04 DIAGNOSIS — R29818 Other symptoms and signs involving the nervous system: Secondary | ICD-10-CM

## 2013-04-04 DIAGNOSIS — F202 Catatonic schizophrenia: Secondary | ICD-10-CM

## 2013-04-04 DIAGNOSIS — E870 Hyperosmolality and hypernatremia: Secondary | ICD-10-CM

## 2013-04-04 DIAGNOSIS — G0490 Encephalitis and encephalomyelitis, unspecified: Secondary | ICD-10-CM

## 2013-04-04 NOTE — Progress Notes (Signed)
Patient ID: [name] Greene, female   DOB: [birth year], 37 y.o.   MRN: [id number]  Chief complaint ; review of recent medication changes, question sedation.  History; this is a 37 year old woman, who we admitted to the facility in December 2013. She was post an extremely complex stay at [facility] ultimately diagnosed with limbic encephalitis. She was also seen by psychiatry for depression and  apparent catatonia. She came here on a complex medical regimen, which included azathioprine, prednisone at 60 mg, and Ambien 10 mg every 6 hours routinely used as "off label" treatment for catatonia. She has recently been back to see her neurologist at [facility] and he has outlined a tapering course of prednisone to 40 mg. The azathioprine has been increased to 150 mg daily.. Lab work was done at the physician's office. She was also noted to have hypernatremia last checked here on March 10 at 148.   The patient has shown some good improvement. Her PEG tube was removed in March. She is eating and drinking on her own. She is able to verbalize, talks to her mother on the phone. Staff report that she actually seems somewhat sedated with the Ambien. I don't believe she has ever had any psychiatric followup at [facility]. The last note from her neurologist was from April 24 at which time the patient was tapered somewhat on her prednisone to 40 mg daily. Her azathioprine was increased to 150 mg daily last CBC on September 24 showed a white count of 9.9 hemoglobin 11.7 differential, the white count was normal and her platelet count was also normal recent basic metabolic panel on October 30 was normal. I had reduced the Ambien to 5 mg by  by mouth every 8 hours. I still episodically get reports that she is excessively sedated and I had not seen any return of the catatonia for which it had been used them off label fashion therefore I think this could be further reduced to. I had thought that she was going to have an MRI of the brain although I  don't know that that was ever done, nor am I am I precisely sure when her next followup is with neurology  Medications; ferrous sulfate 325 daily, Senokot 2 tablets each bedtime, vitamin D 3 1000 units daily, azathioprine 150 mg daily, prednisone 40 mg daily, Colace 100 twice a day, Keppra 750 twice daily, Ambien 5 mg every 6 hours routinely for due to the history of catatonia   Past medical history this is reviewed.  Physical exam. Gen. no acute distress. [name]. HEENT; mouth looks normal Respiratory clear bilaterally. Cardiac heart sounds are normal. No murmurs. Musculoskeletal she continues to have her extension at the PIP of both hands. I wonder some point if she had active synovitis, although she has not had any currently Mental status: She would not verbalize with me today although she does write answers appropriately. There are some cognitive deficits most likely. She is made good functional improvements she is able to stand walk a bit although her balance is poor.  Impression/plan #1 limbic encephalitis . This actually has made some decent improvement and she has been to see her neurologist with increasing dose of azathioprine to 150 mg a day and a taper of the prednisone initiated. #2 catatonia. Again, I am going to taper the Ambien to off #3 hypernatremia. This has not been a recent problem last sodium of 143

## 2013-04-11 ENCOUNTER — Non-Acute Institutional Stay (SKILLED_NURSING_FACILITY): Payer: Medicaid Other | Admitting: Internal Medicine

## 2013-04-11 DIAGNOSIS — G0490 Encephalitis and encephalomyelitis, unspecified: Secondary | ICD-10-CM

## 2013-04-11 NOTE — Progress Notes (Signed)
Patient ID: [name] Greene, female   DOB: 03-04-1976, 37 y.o.   MRN: [id number]  Chief complaint ; review of recent medication changes, mental status changes.  History; this is a 36 year old woman, who we admitted to the facility in December 2013. She was post an extremely complex stay at [facility] ultimately diagnosed with limbic encephalitis. She was also seen by psychiatry for depression and  apparent catatonia. She came here on a complex medical regimen, which included azathioprine, prednisone at 60 mg, and Ambien 10 mg every 6 hours routinely used as "off label" treatment for catatonia. She has recently been back to see her neurologist at [facility] and he has outlined a tapering course of prednisone to 40 mg. The azathioprine has been increased to 150 mg daily.. Lab work was done at the physician's office. She was also noted to have hypernatremia last checked here on October 30 her sodium was 143  The patient has shown some good improvement. Her PEG tube was removed in March. She is eating and drinking on her own. She is able to verbalize, talks to her mother on the phone. Staff report that she actually seems somewhat sedated with the Ambien. I don't believe she has ever had any psychiatric followup at [facility]. The last note from her neurologist was from April 24 at which time the patient was tapered somewhat on her prednisone to 40 mg daily. Her azathioprine was increased to 150 mg daily last CBC on September 24 showed a white count of 9.9 hemoglobin 11.7 differential, the white count was normal and her platelet count was also normal recent basic metabolic panel on October 30 was normal. I had reduced the Ambien to 5 mg by  by mouth every 8 hours. I still episodically get reports that she is excessively sedated and I had not seen any return of the catatonia for which it had been used them off label fashion therefore I think this could be further reduced to. I had thought that she was going to have an MRI of the  brain although I don't know that that was ever done, nor am I am I precisely sure when her next followup is with neurology   I saw her last week I outlined a taper of the Ambien 5 mg by mouth every 8 25 mg every morning and each bedtime. My intent was to taper this to off over the next 4 weeks however patient's behavior has deteriorated. Staff note that she is refusing to open her mouth and she is often tremulous especially involving both legs making them wonder whether she might having a seizure. At the breakfast table today she was observed to be pushing herself away from the table and then pulling herself purposefully into the table. There was some concern about return of the "catatonia"   Medications; ferrous sulfate 325 daily, Senokot 2 tablets each bedtime, vitamin D 3 1000 units daily, azathioprine 150 mg daily, prednisone 40 mg daily, Colace 100 twice a day, Keppra 750 twice daily, Ambien 5 mg every 6 hours routinely for due to the history of catatonia   Past medical history this is reviewed.  Physical exam. Gen. no acute distress. Even while she is shaking she is able to localize to my voice when I call her name therefore I don't believe this is any seizure disorder. HEENT; mouth looks normal Respiratory clear bilaterally. Cardiac heart sounds are normal. No murmurs. Musculoskeletal she continues to have her extension at the PIP of both hands. I  wonder some point if she had active synovitis, although she has not had any currently Mental status: She would not verbalize with me today although she does write answers appropriately. There are some cognitive deficits most likely. She is made good functional improvements she is able to stand walk a bit although her balance is poor.  Impression/plan #1 limbic encephalitis . I don't really see anything that resembles catatonia here however there appears to have been a return of poor behavior since I started to taper the Ambien. I am therefore  going interrupted taper and leave her on Ambien at 5 mg every morning and each bedtime rather than further tapering this at this point. I will monitor her behavior. I am not completely certain about her followup at [facility] although followup is necessary. I work as been ordered next week to followup with her blood counts on azathioprine

## 2013-05-05 ENCOUNTER — Non-Acute Institutional Stay (SKILLED_NURSING_FACILITY): Payer: Medicaid Other | Admitting: Adult Health

## 2013-05-05 DIAGNOSIS — D649 Anemia, unspecified: Secondary | ICD-10-CM

## 2013-05-05 DIAGNOSIS — K5900 Constipation, unspecified: Secondary | ICD-10-CM

## 2013-05-05 DIAGNOSIS — F061 Catatonic disorder due to known physiological condition: Secondary | ICD-10-CM

## 2013-05-05 DIAGNOSIS — R29818 Other symptoms and signs involving the nervous system: Secondary | ICD-10-CM

## 2013-05-05 DIAGNOSIS — G0490 Encephalitis and encephalomyelitis, unspecified: Secondary | ICD-10-CM

## 2013-05-05 DIAGNOSIS — R569 Unspecified convulsions: Secondary | ICD-10-CM

## 2013-05-05 DIAGNOSIS — F202 Catatonic schizophrenia: Secondary | ICD-10-CM

## 2013-05-08 ENCOUNTER — Encounter: Payer: Self-pay | Admitting: Adult Health

## 2013-05-08 NOTE — Progress Notes (Signed)
Patient ID: [name] Greene, female   DOB: 1975-09-16, 37 y.o.   MRN: [id number]     [name]  No Known Allergies   Chief Complaint  Patient presents with  . Medical Managment of Chronic Issues    HPI:  She is being seen for the management of her chronic illnesses. She has had her ambien lowered to 5 mg twice daily; for the process of attempting to wean her off this this medication for her catatanoia.  When she began the bid dosing nursing and family noted changes in her behavior that was concerning. Dr. Leanord Hawking at that time decided to have her remain on the bid dosing.  There are no concerns being voiced by the nursing staff at this time. She is not participating the in the hpi or ros. I am told that she will talk with her family and staff at times.    Past Medical History  Diagnosis Date  . Hydrocephalus   . Muscle weakness (generalized)   . Seizures   . Hypertension   . Limbic encephalitis 02/16/2013  . Anemia, unspecified 02/16/2013  . Catatonia 02/16/2013    Past Surgical History  Procedure Laterality Date  . Foot surgery  1990's  . Cesarean section  02/02/10    VITAL SIGNS BP 110/70  Pulse 79  Ht 5\' 10"  (1.778 m)  Wt 160 lb (72.576 kg)  BMI 22.96 kg/m2   Patient's Medications  New Prescriptions   No medications on file  Previous Medications   ACETAMINOPHEN (TYLENOL) 650 MG SUPPOSITORY    Place 650 mg rectally every 4 (four) hours as needed. For tempeture   AZATHIOPRINE (IMURAN) 50 MG TABLET    Take 150 mg by mouth daily.    CHOLECALCIFEROL (VITAMIN D) 1000 UNITS TABLET    Take 1,000 Units by mouth daily.   DOCUSATE SODIUM (COLACE) 100 MG CAPSULE    Take 100 mg by mouth 2 (two) times daily.   FERROUS SULFATE 325 (65 FE) MG TABLET    Take 325 mg by mouth 2 (two) times daily with a meal.    LEVETIRACETAM (KEPPRA) 750 MG TABLET    Take 1 tablet (750 mg total) by mouth every 12 (twelve) hours.   POLYETHYLENE GLYCOL (MIRALAX / GLYCOLAX) PACKET    Take 17 g by mouth 3  (three) times daily.    PREDNISONE (DELTASONE) 50 MG TABLET    Take 40 mg by mouth daily.    SENNA (SENOKOT) 8.6 MG TABLET    Take 2 tablets by mouth daily.    ZOLPIDEM (AMBIEN) 5 MG TABLET    Take 5 mg by mouth 2 (two) times daily.  Modified Medications   No medications on file  Discontinued Medications   No medications on file    SIGNIFICANT DIAGNOSTIC EXAMS   LABS REVIEWED:   08-03-12: wbc 8.3; hgb 11.1; hct 33.9; mcv 91.9; plt 333; glucose 78; bun 13; creat 0.87; k+3.4; na++143 Liver normal albumin 3.5  08-08-12; glucose 85; bun 11; creat 0.89; k+3.5; na++ 148 11-30-12: wbc 11.;2 hgb 12.4; hct 36.6; mcv 91.5 plt 377 02-22-13: wbc 9.9;hgb 11.7; hct 34.7 ;mcv93.5; plt 301; glucose 78;bun 10; creat 0.88; k+3.4; na++143; liver normal albumin 3.6 02-23-13: glucose 97; bun 17; creat 0.96; k+3.8; na++ 143  03-27-13: glucose 99; bun 13; creat 0.94; k+3.3; na++ 143 03-30-13: glucose 85; bun 11; creat 0.75; k+4.0; na++ 143 04-17-13: wbc 11.7;hgb 13.4; hct 39.0 mcv 95.6 plt 352     Review of Systems  Unable to  perform ROS   Physical Exam  Constitutional: She appears well-developed and well-nourished.  Neck: Neck supple. No JVD present.  Cardiovascular: Normal rate, regular rhythm and intact distal pulses.   Respiratory: Effort normal and breath sounds normal. No respiratory distress.  GI: Soft. Bowel sounds are normal. She exhibits no distension. There is no tenderness.  Musculoskeletal: Normal range of motion. is out of bed to wheelchair; does ambulate with staff.  Neurological: She is alert.  Skin: Skin is warm and dry.      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 She will verbalize at times  Catatonia She is presently without change in her status; will continue her  ambien 5 mg twice daily and will continue to monitor her status. At this time will not make changes to her ambien.   Seizures No reports of seizure activity present will continue keppra 750 mg twice daily and will monitor her status   Anemia, unspecified Is stable will continue iron twice  daily   Constipation Will continue colace twice daily senna  daily and miralax three times daily

## 2013-05-09 ENCOUNTER — Inpatient Hospital Stay (HOSPITAL_COMMUNITY): Payer: Medicaid Other

## 2013-05-09 ENCOUNTER — Non-Acute Institutional Stay (SKILLED_NURSING_FACILITY): Payer: Medicaid Other | Admitting: Internal Medicine

## 2013-05-09 ENCOUNTER — Inpatient Hospital Stay (HOSPITAL_COMMUNITY)
Admission: EM | Admit: 2013-05-09 | Discharge: 2013-05-15 | DRG: 640 | Disposition: A | Discharge status: Skilled Nursing Facility | Payer: Medicaid Other | Attending: Internal Medicine | Admitting: Internal Medicine

## 2013-05-09 ENCOUNTER — Encounter (HOSPITAL_COMMUNITY): Payer: Self-pay | Admitting: Emergency Medicine

## 2013-05-09 DIAGNOSIS — D649 Anemia, unspecified: Secondary | ICD-10-CM

## 2013-05-09 DIAGNOSIS — R29818 Other symptoms and signs involving the nervous system: Secondary | ICD-10-CM

## 2013-05-09 DIAGNOSIS — N390 Urinary tract infection, site not specified: Secondary | ICD-10-CM

## 2013-05-09 DIAGNOSIS — F3289 Other specified depressive episodes: Secondary | ICD-10-CM | POA: Diagnosis present

## 2013-05-09 DIAGNOSIS — G0490 Encephalitis and encephalomyelitis, unspecified: Secondary | ICD-10-CM

## 2013-05-09 DIAGNOSIS — G9341 Metabolic encephalopathy: Secondary | ICD-10-CM | POA: Diagnosis present

## 2013-05-09 DIAGNOSIS — R569 Unspecified convulsions: Secondary | ICD-10-CM | POA: Diagnosis present

## 2013-05-09 DIAGNOSIS — F061 Catatonic disorder due to known physiological condition: Secondary | ICD-10-CM

## 2013-05-09 DIAGNOSIS — I10 Essential (primary) hypertension: Secondary | ICD-10-CM | POA: Diagnosis present

## 2013-05-09 DIAGNOSIS — IMO0002 Reserved for concepts with insufficient information to code with codable children: Secondary | ICD-10-CM

## 2013-05-09 DIAGNOSIS — E870 Hyperosmolality and hypernatremia: Principal | ICD-10-CM

## 2013-05-09 DIAGNOSIS — F202 Catatonic schizophrenia: Secondary | ICD-10-CM

## 2013-05-09 DIAGNOSIS — F068 Other specified mental disorders due to known physiological condition: Secondary | ICD-10-CM | POA: Diagnosis present

## 2013-05-09 DIAGNOSIS — B9620 Unspecified Escherichia coli [E. coli] as the cause of diseases classified elsewhere: Secondary | ICD-10-CM | POA: Diagnosis present

## 2013-05-09 DIAGNOSIS — N170 Acute kidney failure with tubular necrosis: Secondary | ICD-10-CM | POA: Diagnosis present

## 2013-05-09 DIAGNOSIS — K439 Ventral hernia without obstruction or gangrene: Secondary | ICD-10-CM

## 2013-05-09 DIAGNOSIS — E860 Dehydration: Secondary | ICD-10-CM

## 2013-05-09 DIAGNOSIS — E876 Hypokalemia: Secondary | ICD-10-CM | POA: Diagnosis present

## 2013-05-09 DIAGNOSIS — A498 Other bacterial infections of unspecified site: Secondary | ICD-10-CM | POA: Diagnosis present

## 2013-05-09 DIAGNOSIS — F329 Major depressive disorder, single episode, unspecified: Secondary | ICD-10-CM | POA: Diagnosis present

## 2013-05-09 DIAGNOSIS — G40909 Epilepsy, unspecified, not intractable, without status epilepticus: Secondary | ICD-10-CM | POA: Diagnosis present

## 2013-05-09 DIAGNOSIS — M6208 Separation of muscle (nontraumatic), other site: Secondary | ICD-10-CM

## 2013-05-09 DIAGNOSIS — E2749 Other adrenocortical insufficiency: Secondary | ICD-10-CM | POA: Diagnosis present

## 2013-05-09 DIAGNOSIS — K5900 Constipation, unspecified: Secondary | ICD-10-CM

## 2013-05-09 LAB — BASIC METABOLIC PANEL
BUN: 29 mg/dL — ABNORMAL HIGH (ref 6–23)
BUN: 25 mg/dL — ABNORMAL HIGH (ref 6–23)
CO2: 29 mEq/L (ref 19–32)
Chloride: >130 mEq/L (ref 96–112)
Chloride: >130 mEq/L (ref 96–112)
Creatinine, Ser: 1.22 mg/dL — ABNORMAL HIGH (ref 0.50–1.10)
Creatinine, Ser: 1.21 mg/dL — ABNORMAL HIGH (ref 0.50–1.10)
GFR calc Af Amer: 65 mL/min — ABNORMAL LOW (ref >90)
GFR calc Af Amer: 66 mL/min — ABNORMAL LOW (ref >90)
Glucose, Bld: 80 mg/dL (ref 70–99)
Potassium: 3.2 mEq/L — ABNORMAL LOW (ref 3.5–5.1)
Potassium: 3.4 mEq/L — ABNORMAL LOW (ref 3.5–5.1)

## 2013-05-09 LAB — LACTIC ACID, PLASMA: Lactic Acid, Venous: 1.5 mmol/L (ref 0.5–2.2)

## 2013-05-09 LAB — CBC WITH DIFFERENTIAL/PLATELET
HCT: 47.4 % — ABNORMAL HIGH (ref 36.0–46.0)
Hemoglobin: 15.1 g/dL — ABNORMAL HIGH (ref 12.0–15.0)
Lymphocytes Relative: 17 % (ref 12–46)
Lymphs Abs: 2.1 10*3/uL (ref 0.7–4.0)
MCHC: 31.9 g/dL (ref 30.0–36.0)
Monocytes Absolute: 0.5 10*3/uL (ref 0.1–1.0)
Monocytes Relative: 4 % (ref 3–12)
Neutro Abs: 9.6 10*3/uL — ABNORMAL HIGH (ref 1.7–7.7)
Neutrophils Relative %: 78 % — ABNORMAL HIGH (ref 43–77)
RBC: 4.53 MIL/uL (ref 3.87–5.11)
WBC: 12.2 10*3/uL — ABNORMAL HIGH (ref 4.0–10.5)

## 2013-05-09 LAB — URINALYSIS, ROUTINE W REFLEX MICROSCOPIC
Glucose, UA: NEGATIVE mg/dL
Protein, ur: NEGATIVE mg/dL
Specific Gravity, Urine: 1.023 (ref 1.005–1.030)
Urobilinogen, UA: 1 mg/dL (ref 0.0–1.0)

## 2013-05-09 LAB — URINE MICROSCOPIC-ADD ON

## 2013-05-09 LAB — PHOSPHORUS: Phosphorus: 2.8 mg/dL (ref 2.3–4.6)

## 2013-05-09 LAB — MRSA PCR SCREENING: MRSA by PCR: NEGATIVE

## 2013-05-09 MED ORDER — SODIUM CHLORIDE 0.9 % IV BOLUS (SEPSIS)
1000.0000 mL | Freq: Once | INTRAVENOUS | Status: AC
  Administered 2013-05-09: 1000 mL via INTRAVENOUS

## 2013-05-09 MED ORDER — ASPIRIN 300 MG RE SUPP
300.0000 mg | RECTAL | Status: AC
  Administered 2013-05-09: 300 mg via RECTAL
  Filled 2013-05-09: qty 1

## 2013-05-09 MED ORDER — SODIUM CHLORIDE 0.9 % IV SOLN: 250.0000 mL | INTRAVENOUS | Status: DC | PRN

## 2013-05-09 MED ORDER — INSULIN ASPART 100 UNIT/ML ~~LOC~~ SOLN
0.0000 [IU] | SUBCUTANEOUS | Status: DC
  Administered 2013-05-12: 3 [IU] via SUBCUTANEOUS
  Administered 2013-05-12: 2 [IU] via SUBCUTANEOUS

## 2013-05-09 MED ORDER — SODIUM CHLORIDE 0.9 % IV SOLN: Freq: Once | INTRAVENOUS | Status: DC

## 2013-05-09 MED ORDER — DEXTROSE 50 % IV SOLN
25.0000 mL | Freq: Once | INTRAVENOUS | Status: AC | PRN
  Administered 2013-05-09: 25 mL via INTRAVENOUS
  Filled 2013-05-09: qty 50

## 2013-05-09 MED ORDER — ASPIRIN 81 MG PO CHEW: 324.0000 mg | CHEWABLE_TABLET | ORAL | Status: AC

## 2013-05-09 MED ORDER — HEPARIN SODIUM (PORCINE) 5000 UNIT/ML IJ SOLN
5000.0000 [IU] | Freq: Three times a day (TID) | INTRAMUSCULAR | Status: DC
  Administered 2013-05-09 – 2013-05-15 (×18): 5000 [IU] via SUBCUTANEOUS
  Filled 2013-05-09 (×21): qty 1

## 2013-05-09 MED ORDER — DEXTROSE 5 % IV SOLN
1.0000 g | INTRAVENOUS | Status: DC
  Administered 2013-05-09 – 2013-05-11 (×3): 1 g via INTRAVENOUS
  Filled 2013-05-09 (×5): qty 10

## 2013-05-09 MED ORDER — FAMOTIDINE IN NACL 20-0.9 MG/50ML-% IV SOLN
20.0000 mg | Freq: Two times a day (BID) | INTRAVENOUS | Status: DC
  Administered 2013-05-09 – 2013-05-12 (×6): 20 mg via INTRAVENOUS
  Filled 2013-05-09 (×8): qty 50

## 2013-05-09 MED ORDER — SODIUM CHLORIDE 0.45 % IV SOLN
INTRAVENOUS | Status: DC
  Administered 2013-05-10: 01:00:00 via INTRAVENOUS

## 2013-05-09 NOTE — ED Notes (Signed)
[facility] [phone]  Cell [phone] [name] Greene.Marland KitchenMarland Kitchen

## 2013-05-09 NOTE — Progress Notes (Signed)
Patient ID: [name] Greene, female   DOB: 1976/02/15, 37 y.o.   MRN: [id number]  Chief complaint ; called yesterday to report declining by mouth intake, declining functional status over the last 2-3 weeks  History; this is a 37 year old woman, who we admitted to the facility in December 2013. She was post an extremely complex stay at [facility] ultimately diagnosed with limbic encephalitis. She was also seen by psychiatry for depression and  apparent catatonia. She came here on a complex medical regimen, which included azathioprine, prednisone at 60 mg, and Ambien 10 mg every 6 hours routinely used as "off label" treatment for catatonia. She has recently been back to see her neurologist at [facility] in April and he has outlined a tapering course of prednisone to 40 mg. The azathioprine has been increased to 150 mg daily.. Lab work was done at the physician's office. She was also noted to have hypernatremia last checked here on October 30 her sodium was 143  The patient has generally shown some good improvement  Her PEG tube was removed in March. She is eating and drinking on her own. She is able to verbalize, talks to her mother on the phone. Staff report that she actually seems somewhat sedated with the Ambien. I don't believe she has ever had any psychiatric followup at [facility]. The last note from her neurologist was from April 24 at which time the patient was tapered somewhat on her prednisone to 40 mg daily. Her azathioprine was increased to 150 mg daily last CBC on November 17 showed a white count of 11.7 hemoglobin 13.4 differential, the white count was normal and her platelet count was also normal recent basic metabolic panel on October 30 was normal with a sodium of 143 BUN of 11 creatinine of 0.75. I had thought that she was going to have an MRI of the brain although I don't know that that was ever done, nor am I am I precisely sure when her next followup is with neurology. Her last appointment with neurology at  [facility] was in November however I have no information on this. I am uncertain whether a followup MRI it was suggested in April was ever done  I was called yesterday to report that the patient had deteriorated over the last 2-3 weeks. Staff of noted declining by mouth intake. She is no longer feeding herself. She was actually at home with her mother for a week over Thanksgiving and other than some decrease in what she was eating and drinking doesn't sound like there are any specific issues. I was first called about this yesterday ordered lab work over the phone chest x-ray and a urine. I am seeing  her acutely today because of this   Medications; ferrous sulfate 325 daily, Senokot 8.62 tablets daily, vitamin D 3 1000 units daily, Imuran 150 by mouth daily, prednisone liquid 40 mg daily, Colace 100 twice a day, Keppra 750 mg 3 times a day, MiraLax 17 g every 8 hours, Ambien 5 mg one by mouth every morning each bedtime [this is being used off label for catatonia]  Lab work; from yesterday her white count is 10.4 hemoglobin 14.9 platelet count 219 differential count shows 89% neutrophils. Sodium is 167, potassium 3.4, chloride 126, CO2 33, glucose 200, BUN is 30 and creatinine 1.33. AST is 29, ALT 56 albumin 4.4 calcium 9.8 alkaline phosphatase is 89, total bilirubin 1.2   Past medical history this is reviewed.  Physical exam. Blood pressure 116/95, temperature 99.3 rectal,  pulse 133 O2 sat 96% Gen. no acute distress. When I came in the room I waved that her she waved back with her right hand . She is awake and responsive to her mother. HEENT; very dry with a large amount of undigested food at the back of her throat. Large birthmark on the right side of her jaw Lymphadenopathy; nonpalpable no cervical clavicular or axillary area Respiratory clear bilaterally. Cardiac heart sounds are normal. No murmurs. She is dehydrated Musculoskeletal she continues to have her extension at the PIP of both hands. I wonder  some point if she had active synovitis, although she has not had any currently. This is not new Neurologic; cranial nerves pupils are small but react, extraocular movements seem intact. She appears to have some right-sided facial weakness. I don't see any new focal neurologic weakness. She is able to move both her legs and both her arms reflexes seem intact. Left plantar is extensor right is equivocal. No Hoffmans reflects bilaterally Mental status; miraculously given her electrolyte abnormalities she seems awake and responsive. She never verbalizes with me but apparently can't verbalize.   Impression/plan #1 limbic encephalitis with severe neurologic compromise. The patient has actually done fairly well over the last year until recently he was functional walking with a walker able to feed herself. She has remained on prednisone 40 mg and Imuran 150. #2 history of catatonic posture. I have never seen this in her however it is the reason she is on Ambien being used off label for this. This was prescribed by [facility] psychiatry. At one point several months ago stopped reported that she was more listless I. therefore reduce the Ambien to 5 mg twice a day. By my view she has not had any ill effect from this #3 severe hypernatremia; the fact that she is tall conscious and responsive means that this did not happen quickly. Whether this has more of an explanation than just inadequate fluid intake I am uncertain. She will however need this properly corrected and I think this is going to need to be done in the hospital. Further whether this is the cause or a consequence of her described deterioration over the last 2-3 weeks' time further uncertain about this. In any case she will need to hypernatremia corrected to see if this by itself will result in improvement in her overall function. Her family is concerned that the deterioration is caused by the tapering of her Ambien which I believe at one point was at 10 mg 4 times a  day. I don't believe this is the case, she is not catatonic #4 acute renal insufficiency no doubt secondary to renal factors #5 elevated blood sugars so. She is not a known diabetic. She has been on steroids for over a year. When wonder about an infection either her usual infection pyelonephritis etc or perhaps something more interesting related to her immunosuppression. #6 declining oral intake for. Apparently she is eating nothing over the last 3 days in spite of this her albumin is 4.4.  #7 seizure disorder on Keppra as far as him aware she has not had any recent seizures.   complicated patient. I'll phone the hospitalist to see if we can get her in the hospital to correct the hypernatremia among other issues. Hopefully she'll respond to this without a more complicated hospital course. I discussed this in detail with her family including her mother is at the bedside and her brother who was in [place]

## 2013-05-09 NOTE — H&P (Signed)
PULMONARY  / CRITICAL CARE MEDICINE  Name: [name] Greene MRN: [id number] DOB: 1976-01-23    ADMISSION DATE:  05/09/2013 CONSULTATION DATE:  05/09/2013  REFERRING MD :  Dr. 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    SIGNIFICANT EVENTS / STUDIES:  12/09 BMET with Na at 172, Cl >130, K at 3.2 and Scr. 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  Apparently, the patient has decreased oral intake for the past 2-3 weeks.  Yesterday, his primary care doctor, Dr. Leanord Hawking, was called concerning her changed in intake and not being herself.  Today, she was more altered mental status at the group home so she was sent to the ED.  In the ED, she was found to have severe hypernatremia at 172, hyperchloremia at 130, AKI with serum creatinine at 1.22 compare to 0.77 at baseline, elevated WBC and hemoconcentration on CBC, and UA positive for nitrite and WBC at 11-20.  With her severe hypernatremia and altered mentation, PCCM is asked to manage the patient.  At the time of evaluation, the patient is awake and her eyes follow visual  field movement of my fingers but she is not talkative.  She does smile when I smile at her.  She squeezes my hand once I demonstrated to her.  She cannot provide any further information beyond above.  According to her mother at bedside, at baseline, she is on the wheelchair for the most part at [facility] group home and does walk when she stay with her.  She does talk and communicate at baseline but had not done so for the past 3 days according to the mother.  The mother thinks that her AMS was due to a fever spike about 3 days ago.  No other information is available.  PAST MEDICAL HISTORY :  Past Medical History  Diagnosis Date  . Hydrocephalus   . Muscle weakness (generalized)   . Seizures   . Hypertension   . Limbic encephalitis 02/16/2013  . Anemia, unspecified 02/16/2013  . Catatonia 02/16/2013   Past Surgical History  Procedure Laterality Date  . Foot surgery  1990's  . Cesarean section  02/02/10   Prior to Admission medications   Medication Sig Start Date End Date Taking? Authorizing Provider  acetaminophen (TYLENOL) 650 MG suppository Place 650 mg rectally every 6 (six) hours as needed for fever. For tempeture   Yes Historical Provider, MD  azaTHIOprine (IMURAN) 50 MG tablet Take 150 mg by mouth daily.    Yes Historical Provider, MD  cholecalciferol (VITAMIN D) 1000 UNITS tablet Take 1,000 Units by mouth daily.   Yes Historical Provider, MD  docusate sodium (COLACE) 100 MG capsule  Take 100 mg by mouth 2 (two) times daily.   Yes Historical Provider, MD  ferrous sulfate 325 (65 FE) MG tablet Take 325 mg by mouth daily with breakfast.    Yes Historical Provider, MD  levETIRAcetam (KEPPRA) 750 MG tablet Take 1 tablet (750 mg total) by mouth every 12 (twelve) hours. 10/20/12  Yes Tiffany Irine Seal, PA-C  Magnesium Hydroxide (MILK OF MAGNESIA PO) Take 30 mLs by mouth daily as needed (constipation).   Yes Historical Provider, MD  polyethylene glycol (MIRALAX / GLYCOLAX) packet Take 17 g by mouth  3 (three) times daily.    Yes Historical Provider, MD  prednisoLONE (ORAPRED) 15 MG/5ML solution Take 40 mg by mouth daily before breakfast.   Yes Historical Provider, MD  senna (SENOKOT) 8.6 MG tablet Take 2 tablets by mouth daily.    Yes Historical Provider, MD  zolpidem (AMBIEN) 5 MG tablet Take 5 mg by mouth 2 (two) times daily.   Yes Historical Provider, MD   No Known Allergies  FAMILY HISTORY:  History reviewed. No pertinent family history. SOCIAL HISTORY:  reports that she has never smoked. She does not have any smokeless tobacco history on file. She reports that she does not drink alcohol. Her drug history is not on file.  REVIEW OF SYSTEMS: ROS is not possible since patient is not communicating during admission evaluation  SUBJECTIVE:  Per HPI Above  VITAL SIGNS: Temp:  [98.6 F (37 C)] 98.6 F (37 C) (12/09 1259) Pulse Rate:  [102-107] 107 (12/09 1531) Resp:  [13-14] 13 (12/09 1531) BP: (110-114)/(86-90) 114/90 mmHg (12/09 1531) SpO2:  [97 %-99 %] 99 % (12/09 1531) Weight:  [72.576 kg (160 lb)] 72.576 kg (160 lb) (12/09 1259) HEMODYNAMICS:   VENTILATOR SETTINGS:   INTAKE / OUTPUT: Intake/Output   None     PHYSICAL EXAMINATION: General:  MR appearing female lying in bed in no acute distress Neuro:  Awake but no communicating, does follow some commands HEENT:  NCAT, EOMI, PERRLA, open mouth and does smile at me, no sign of discharge or infection Cardiovascular:  Sinus tachycardia, no murmur/rub/gallop Lungs:  CTAB, no adventitious sounds anteriorly Abdomen:  Distended, no guarding, (+)BS, nontender Musculoskeletal:  Intact, no lesion, no ulcers, positive and equal pulses, dehydration on skin test Skin:  Dry and slight cool to touch, no lesion, no ulcers  LABS:  CBC  Recent Labs Lab 05/09/13 1308  WBC 12.2*  HGB 15.1*  HCT 47.4*  PLT 175   Coag's No results found for this basename: APTT, INR,  in the last 168 hours BMET  Recent Labs Lab  05/09/13 1308  NA 172*  K 3.2*  CL >130*  CO2 29  BUN 29*  CREATININE 1.22*  GLUCOSE 104*   Electrolytes  Recent Labs Lab 05/09/13 1308  CALCIUM 9.4   Sepsis Markers No results found for this basename: LATICACIDVEN, PROCALCITON, O2SATVEN,  in the last 168 hours ABG No results found for this basename: PHART, PCO2ART, PO2ART,  in the last 168 hours Liver Enzymes No results found for this basename: AST, ALT, ALKPHOS, BILITOT, ALBUMIN,  in the last 168 hours Cardiac Enzymes No results found for this basename: TROPONINI, PROBNP,  in the last 168 hours Glucose No results found for this basename: GLUCAP,  in the last 168 hours  Imaging No results found.   CXR:  12/09 Ordered, pending  ASSESSMENT / PLAN:  PULMONARY A: No Acute Process  P:   - No intervention - Oxygen through [place] as  needed -pcxr to r/o aspiration as source  CARDIOVASCULAR A:  Sinus Tachycardia (hypovolemia severe)  --> Likely secondary to decrease oral intake and dehydration  --> UTI with possible developing sepsis P:  - IVF with rate, see renal section - Rocephin for UTI, see infectious section - Monitor heart rate -lactic x 1  RENAL A:   Severe Hypernatremia, Na at 172 Severe Hyperchloremia, Cl > 130 Acute Kidney Injury (hypovolemia, atn)  --> baseline at 0.77, currently at 1.22  --> dehydration, ?developing sepsis?  P:   - Calculated water deficit is 17L - Will correct sodium no more than 10-29mEq/day - Calculated rate at 120cc/hour x20 hours for correction - BMET Q4H and adjust accordingly - Lactic acid level check  GASTROINTESTINAL A:   No Current nor Documented Problem  P:   - No intervention - Encourage PO intake  HEMATOLOGIC A:   Leukocytosis at 12.2  --> Likely due to UTI, see infectious section Hemoconcentration  --> Likely due to dehyration P:  - IVF correction as above under renal section -cbc for dilution in am   INFECTIOUS A:   UTI  --> UA with  nitrite and WBC at 11-25  --> Had fever a few days ago per report P:   - Rocephin for treatment - Aggressive IVF - Urine culture sent - CXR for chest evaluation  ENDOCRINE A:   Hyperglycemia  --> No documented DM  --> She is on chronic Steroid  P:   - Insulin sliding scale  NEUROLOGIC A:   Limbic Encephalitis from History  --> Diagnosed 2 years ago at [facility], unclear etiology  --> MR as residual symptoms as well as catatonic  --> On Ambien 5mg  BID, Prednisone 40mg  QD, Imuran 150mg  QD for treatment Altered Mental Status  --> Likely dehydration Seizure Disorder on Keppra at Home  P:   - Continue home medication at home dose - Keppra level?  TODAY'S SUMMARY: Altered mental status in setting of UTI, hypernatremia, and decrease oral intake.  Aggressive IVF replacement to correct sodium level and fluid status.  Antibiotic for UTI.  Will follow with frequent BMETs. SDU  I have personally obtained a history, examined the patient, evaluated laboratory and imaging results, formulated the assessment and plan and placed orders.  CRITICAL CARE: The patient is critically ill with multiple organ systems failure and requires high complexity decision making for assessment and support, frequent evaluation and titration of therapies, application of advanced monitoring technologies and extensive interpretation of multiple databases. Critical Care Time devoted to patient care services described in this note is 35 minutes.   Mcarthur Rossetti. Tyson Alias, MD, FACP Pgr: [phone] [facility]  Pulmonary and Critical Care Medicine [facility] Pager: [phone]  05/09/2013, 3:55 PM

## 2013-05-09 NOTE — ED Notes (Signed)
PA notified of critical labs. Sodium of 172  and chloride >130.

## 2013-05-09 NOTE — ED Notes (Signed)
Patient presents to ED via EMS from nursing home with catatonia since this AM.

## 2013-05-09 NOTE — ED Provider Notes (Signed)
CSN: [id number]     Arrival date & time 05/09/13  1245 History   First MD Initiated Contact with Patient 05/09/13 1307     Chief Complaint  Patient presents with  . Failure To Thrive   (Consider location/radiation/quality/duration/timing/severity/associated sxs/prior Treatment) HPI Comments: Patient is a 37 year old female with a past medical history of limbic encephalopathy, hydrocephalus, and catatonia presents from a nursing home for "not acting herself" for the past week. Symptoms started gradually and progressively worsened since the onset. Patient's family members provide the history due to patient being unable to communicate. Patient's family wanted her to come to the ED for evaluation. EMS states the patient has been catatonic since this morning, according to staff at the nursing home. Patient has a history of episodes of catatonia previously. No other associated symptoms. No aggravating/alleviating factors.   Past Medical History  Diagnosis Date  . Hydrocephalus   . Muscle weakness (generalized)   . Seizures   . Hypertension   . Limbic encephalitis 02/16/2013  . Anemia, unspecified 02/16/2013  . Catatonia 02/16/2013   Past Surgical History  Procedure Laterality Date  . Foot surgery  1990's  . Cesarean section  02/02/10   History reviewed. No pertinent family history. History  Substance Use Topics  . Smoking status: Never Smoker   . Smokeless tobacco: Not on file  . Alcohol Use: No   OB History   Grav Para Term Preterm Abortions TAB SAB Ect Mult Living                 Review of Systems  Constitutional: Negative for fever, chills and fatigue.  HENT: Negative for trouble swallowing.   Eyes: Negative for visual disturbance.  Respiratory: Negative for shortness of breath.   Cardiovascular: Negative for chest pain and palpitations.  Gastrointestinal: Negative for nausea, vomiting, abdominal pain and diarrhea.  Genitourinary: Negative for dysuria and difficulty urinating.   Musculoskeletal: Negative for arthralgias and neck pain.  Skin: Negative for color change.  Neurological: Negative for dizziness and weakness.       Catatonia  Psychiatric/Behavioral: Negative for dysphoric mood.    Allergies  Review of patient's allergies indicates no known allergies.  Home Medications   Current Outpatient Rx  Name  Route  Sig  Dispense  Refill  . acetaminophen (TYLENOL) 650 MG suppository   Rectal   Place 650 mg rectally every 6 (six) hours as needed for fever. For tempeture         . azaTHIOprine (IMURAN) 50 MG tablet   Oral   Take 150 mg by mouth daily.          . cholecalciferol (VITAMIN D) 1000 UNITS tablet   Oral   Take 1,000 Units by mouth daily.         Marland Kitchen docusate sodium (COLACE) 100 MG capsule   Oral   Take 100 mg by mouth 2 (two) times daily.         . ferrous sulfate 325 (65 FE) MG tablet   Oral   Take 325 mg by mouth daily with breakfast.          . levETIRAcetam (KEPPRA) 750 MG tablet   Oral   Take 1 tablet (750 mg total) by mouth every 12 (twelve) hours.   60 tablet   0   . Magnesium Hydroxide (MILK OF MAGNESIA PO)   Oral   Take 30 mLs by mouth daily as needed (constipation).         Marland Kitchen  polyethylene glycol (MIRALAX / GLYCOLAX) packet   Oral   Take 17 g by mouth 3 (three) times daily.          . prednisoLONE (ORAPRED) 15 MG/5ML solution   Oral   Take 40 mg by mouth daily before breakfast.         . senna (SENOKOT) 8.6 MG tablet   Oral   Take 2 tablets by mouth daily.          Marland Kitchen zolpidem (AMBIEN) 5 MG tablet   Oral   Take 5 mg by mouth 2 (two) times daily.          BP 110/86  Pulse 102  Temp(Src) 98.6 F (37 C) (Oral)  Resp 14  Ht 5\' 8"  (1.727 m)  Wt 160 lb (72.576 kg)  BMI 24.33 kg/m2  SpO2 97% Physical Exam  Nursing note and vitals reviewed. Constitutional: She appears well-developed and well-nourished. No distress.  HENT:  Head: Normocephalic and atraumatic.  Eyes: Conjunctivae and EOM are  normal.  Neck: Normal range of motion.  Cardiovascular: Normal rate and regular rhythm.  Exam reveals no gallop and no friction rub.   No murmur heard. Pulmonary/Chest: Effort normal and breath sounds normal. She has no wheezes. She has no rales. She exhibits no tenderness.  Abdominal: Soft. She exhibits no distension. There is no tenderness. There is no rebound and no guarding.  Musculoskeletal: Normal range of motion.  Neurological:  Patient responds to commands such as squeezing my fingers. Patient is not alert due to her current "catatonic" state. Patient responds to pain stimuli.    Skin: Skin is warm and dry.    ED Course  Procedures (including critical care time) Labs Review Labs Reviewed  CBC WITH DIFFERENTIAL - Abnormal; Notable for the following:    WBC 12.2 (*)    Hemoglobin 15.1 (*)    HCT 47.4 (*)    MCV 104.6 (*)    RDW 17.4 (*)    Neutrophils Relative % 78 (*)    Neutro Abs 9.6 (*)    All other components within normal limits  BASIC METABOLIC PANEL - Abnormal; Notable for the following:    Sodium 172 (*)    Potassium 3.2 (*)    Chloride >130 (*)    Glucose, Bld 104 (*)    BUN 29 (*)    Creatinine, Ser 1.22 (*)    GFR calc non Af Amer 56 (*)    GFR calc Af Amer 65 (*)    All other components within normal limits  URINALYSIS, ROUTINE W REFLEX MICROSCOPIC - Abnormal; Notable for the following:    APPearance CLOUDY (*)    Hgb urine dipstick MODERATE (*)    Bilirubin Urine SMALL (*)    Ketones, ur 15 (*)    Nitrite POSITIVE (*)    Leukocytes, UA MODERATE (*)    All other components within normal limits  URINE MICROSCOPIC-ADD ON - Abnormal; Notable for the following:    Squamous Epithelial / LPF FEW (*)    Bacteria, UA MANY (*)    All other components within normal limits  URINE CULTURE  OSMOLALITY   Imaging Review No results found.  EKG Interpretation   None       MDM   1. Hypernatremia     2:01 PM Labs and urinalysis pending. Vitals stable  and patient afebrile. Patient is responding to commands.   3:00 PM Patient's Na is 172 and Chloride is >130. Urinalysis also shows UTI.  Critical care consulted for admission. Patient will have fluids at 149mL/hr for sodium correction.   Emilia Beck, PA-C 05/09/13 1547

## 2013-05-10 ENCOUNTER — Encounter (HOSPITAL_COMMUNITY): Payer: Self-pay | Admitting: *Deleted

## 2013-05-10 ENCOUNTER — Inpatient Hospital Stay (HOSPITAL_COMMUNITY): Payer: Medicaid Other

## 2013-05-10 DIAGNOSIS — D649 Anemia, unspecified: Secondary | ICD-10-CM

## 2013-05-10 LAB — GLUCOSE, CAPILLARY
Glucose-Capillary: 105 mg/dL — ABNORMAL HIGH (ref 70–99)
Glucose-Capillary: 150 mg/dL — ABNORMAL HIGH (ref 70–99)
Glucose-Capillary: 154 mg/dL — ABNORMAL HIGH (ref 70–99)
Glucose-Capillary: 65 mg/dL — ABNORMAL LOW (ref 70–99)
Glucose-Capillary: 66 mg/dL — ABNORMAL LOW (ref 70–99)
Glucose-Capillary: 83 mg/dL (ref 70–99)
Glucose-Capillary: 86 mg/dL (ref 70–99)

## 2013-05-10 LAB — BASIC METABOLIC PANEL
BUN: 23 mg/dL (ref 6–23)
BUN: 21 mg/dL (ref 6–23)
BUN: 19 mg/dL (ref 6–23)
BUN: 12 mg/dL (ref 6–23)
CO2: 29 mEq/L (ref 19–32)
CO2: 28 mEq/L (ref 19–32)
CO2: 28 mEq/L (ref 19–32)
Calcium: 8.4 mg/dL (ref 8.4–10.5)
Calcium: 8.3 mg/dL — ABNORMAL LOW (ref 8.4–10.5)
Calcium: 8.4 mg/dL (ref 8.4–10.5)
Calcium: 8.2 mg/dL — ABNORMAL LOW (ref 8.4–10.5)
Chloride: >130 mEq/L (ref 96–112)
Chloride: >130 mEq/L (ref 96–112)
Creatinine, Ser: 1.09 mg/dL (ref 0.50–1.10)
Creatinine, Ser: 1.02 mg/dL (ref 0.50–1.10)
Creatinine, Ser: 1.11 mg/dL — ABNORMAL HIGH (ref 0.50–1.10)
Creatinine, Ser: 0.98 mg/dL (ref 0.50–1.10)
GFR calc Af Amer: 73 mL/min — ABNORMAL LOW (ref >90)
GFR calc Af Amer: 85 mL/min — ABNORMAL LOW (ref >90)
GFR calc non Af Amer: 70 mL/min — ABNORMAL LOW (ref >90)
GFR calc non Af Amer: 76 mL/min — ABNORMAL LOW (ref >90)
Glucose, Bld: 102 mg/dL — ABNORMAL HIGH (ref 70–99)
Glucose, Bld: 70 mg/dL (ref 70–99)
Glucose, Bld: 73 mg/dL (ref 70–99)
Potassium: 2.9 mEq/L — ABNORMAL LOW (ref 3.5–5.1)
Potassium: 3.8 mEq/L (ref 3.5–5.1)
Sodium: 166 mEq/L (ref 135–145)
Sodium: 160 mEq/L — ABNORMAL HIGH (ref 135–145)

## 2013-05-10 LAB — OSMOLALITY: Osmolality: 357 mosm/kg — ABNORMAL HIGH (ref 275–300)

## 2013-05-10 LAB — CBC
HCT: 39.8 % (ref 36.0–46.0)
Hemoglobin: 12.3 g/dL (ref 12.0–15.0)
MCH: 32.5 pg (ref 26.0–34.0)
MCV: 105.3 fL — ABNORMAL HIGH (ref 78.0–100.0)
RBC: 3.78 MIL/uL — ABNORMAL LOW (ref 3.87–5.11)
RDW: 17.7 % — ABNORMAL HIGH (ref 11.5–15.5)

## 2013-05-10 MED ORDER — BIOTENE DRY MOUTH MT LIQD
15.0000 mL | Freq: Two times a day (BID) | OROMUCOSAL | Status: DC
  Administered 2013-05-10 – 2013-05-15 (×10): 15 mL via OROMUCOSAL

## 2013-05-10 MED ORDER — DEXTROSE 50 % IV SOLN
INTRAVENOUS | Status: AC
  Administered 2013-05-10: 50 mL
  Filled 2013-05-10: qty 50

## 2013-05-10 MED ORDER — POTASSIUM CHLORIDE 10 MEQ/100ML IV SOLN
10.0000 meq | INTRAVENOUS | Status: AC
  Administered 2013-05-10 (×6): 10 meq via INTRAVENOUS
  Filled 2013-05-10 (×6): qty 100

## 2013-05-10 MED ORDER — KCL IN DEXTROSE-NACL 20-5-0.45 MEQ/L-%-% IV SOLN
INTRAVENOUS | Status: DC
  Administered 2013-05-10: 19:00:00 via INTRAVENOUS
  Filled 2013-05-10 (×4): qty 1000

## 2013-05-10 MED ORDER — DEXTROSE 50 % IV SOLN
25.0000 mL | Freq: Once | INTRAVENOUS | Status: AC | PRN
  Administered 2013-05-10: 25 mL via INTRAVENOUS
  Filled 2013-05-10: qty 50

## 2013-05-10 NOTE — Progress Notes (Signed)
Hypoglycemic Event  CBG: 68  Treatment: D50 IV 25 mL  Symptoms: None  Follow-up CBG: Time:23:45 CBG Result:105  Possible Reasons for Event: Inadequate meal intake  Comments/MD notified:Dr. Burley Saver, Emelda Fear  Remember to initiate Hypoglycemia Order Set & complete

## 2013-05-10 NOTE — Progress Notes (Signed)
PULMONARY  / CRITICAL CARE MEDICINE  Name: [name] Greene MRN: [id number] DOB: [birth year]    ADMISSION DATE:  05/09/2013 CONSULTATION DATE:  05/09/2013  REFERRING MD :  Dr. 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    SIGNIFICANT EVENTS / STUDIES:  12/09 BMET with Na at 172, Cl >130, K at 3.2 and Scr. At 1.22 with baseline 0.77 12/09 CXR pending result 12/09 CBC with elevated WBC at 12.2 and hemoconcentration 15.1/47.4 12/09 UA with positive nitrite and WBC 11-25  LINES / TUBES: Peripheral IV 12/09 >>>  CULTURES: 12/09 Urine Culture >>> 12/09 MRSA PCR >>> Negative  ANTIBIOTICS: 12/09 Rocephin >>>  Subjective/Overnight  Denies c/o.  More alert, interactive.   VITAL SIGNS: Temp:  [97.5 F (36.4 C)-99.2 F (37.3 C)] 97.5 F (36.4 C) (12/10 0755) Pulse Rate:  [88-117] 88 (12/10 0755) Resp:  [11-17] 17 (12/10 0755) BP: (102-129)/(77-95) 124/80 mmHg (12/10 0755) SpO2:  [97 %-100 %] 98 % (12/10 0755) Weight:  [141 lb 8.6 oz (64.2 kg)-160 lb (72.576 kg)] 141 lb 8.6 oz (64.2 kg) (12/10 0458) HEMODYNAMICS:   VENTILATOR SETTINGS:   INTAKE / OUTPUT: Intake/Output     12/09 0701 - 12/10 0700 12/10 0701 - 12/11 0700   I.V. (mL/kg) 956 (14.9)    IV Piggyback 550    Total Intake(mL/kg) 1506 (23.5)    Net +1506          Urine Occurrence 1 x      PHYSICAL EXAMINATION: General:  Chronically ill appearing female, NAD  Neuro: awake, answers some questions appropriate, tells me her name, knows she is in the hospital, MAE HEENT:  Mm dry, no JVD  Cardiovascular:  Sinus tachycardia, no murmur/rub/gallop Lungs:  CTAB, no adventitious  sounds anteriorly Abdomen: mildly distended, no guarding, (+)BS, nontender Musculoskeletal:  Intact, no lesion, no ulcers, positive and equal pulses, no edema   LABS:  CBC  Recent Labs Lab 05/09/13 1308 05/10/13 0509  WBC 12.2* 9.2  HGB 15.1* 12.3  HCT 47.4* 39.8  PLT 175 139*   Coag's No results found for this basename: APTT, INR,  in the last 168 hours BMET  Recent Labs Lab 05/10/13 0027 05/10/13 0509 05/10/13 0830  NA 169* 166* 166*  K 2.9* 3.4* 3.5  CL >130* >130* >130*  CO2 29 28 28   BUN 23 21 19   CREATININE 1.09 1.02 1.11*  GLUCOSE 102* 70 73   Electrolytes  Recent Labs Lab 05/09/13 2010 05/10/13 0027 05/10/13 0509 05/10/13 0830  CALCIUM 8.6 8.4 8.3* 8.4  MG 2.7*  --   --   --   PHOS 2.8  --   --   --    Sepsis Markers  Recent Labs Lab 05/09/13 1616  LATICACIDVEN 1.5   ABG No results found for this basename: PHART, PCO2ART, PO2ART,  in the last 168 hours Liver Enzymes No results found for this basename: AST, ALT, ALKPHOS, BILITOT, ALBUMIN,  in the last 168 hours Cardiac Enzymes No results found for this basename: TROPONINI, PROBNP,  in the last 168 hours Glucose  Recent Labs Lab 05/09/13 2124 05/09/13 2349 05/10/13 0501 05/10/13 0759  GLUCAP 68* 105* 66* 86  Imaging Dg Chest Port 1 View  05/10/2013   CLINICAL DATA:  Shortness of breath.  EXAM: PORTABLE CHEST - 1 VIEW  COMPARISON:  05/09/2013 and 10/20/2012  FINDINGS: Cardiomegaly, unchanged. Pulmonary vascularity is normal. Lungs are clear. No effusions. Severe thoracolumbar scoliosis.  IMPRESSION: No acute abnormality.   Electronically Signed   By: Geanie Cooley M.D.   On: 05/10/2013 07:20   Dg Chest Port 1 View  05/09/2013   CLINICAL DATA:  Evaluate for airspace disease, hydrocephalus, seizure disorder  EXAM: PORTABLE CHEST - 1 VIEW  COMPARISON:  10/20/2012  FINDINGS: Chronic S-shaped scoliosis of the thoracic and lumbar spine. Mild cardiac enlargement. Lungs remain clear. No focal  pneumonia, collapse or consolidation. No effusion or pneumothorax. No acute osseous finding.  IMPRESSION: Cardiomegaly without acute process.  Stable exam.   Electronically Signed   By: Ruel Favors M.D.   On: 05/09/2013 17:13     CXR:  12/10 - clear   ASSESSMENT / PLAN:  PULMONARY A: No Acute Process  P:   - No intervention - Oxygen through [place] as needed - pulm hygiene   CARDIOVASCULAR A:  Sinus Tachycardia  - in setting hypovolemia and SIRS  P:  - cont gentle volume  - tele   RENAL A:   Severe Hypernatremia, Na at 172 Severe Hyperchloremia, Cl > 130 Acute Kidney Injury (hypovolemia, atn) Dehydration  P:   - Calculated water deficit is 17L on admission, still a ways to go - Will correct sodium no more than 10-16mEq/day - cont 1/2NS at 146ml/hr - regular NS could be used at it would be hypotonic here but she has not dropped too quickly with 1/2NS so will cont that for now, maintain 1/2 NS as fluid of choice - serial BMET - change to q6   GASTROINTESTINAL A:   No Current nor Documented Problem  P:   - No intervention - Encourage PO intake  HEMATOLOGIC A:   Hemoconcentration - improved   --> Likely due to dehyration P:  - IVF correction as above under renal section - f/u cbc   INFECTIOUS A:   UTI P:   -??urine culture sent - will reorder - Cont Rocephin for now -pcxr unimpressed  ENDOCRINE A:   Hyperglycemia Adrenal insufficiency - chronic steroids r/t limbic encephalitis  P:   - Insulin sliding scale - cont pred   NEUROLOGIC A:   Limbic Encephalitis from History  --> Diagnosed 2 years ago at [facility], unclear etiology  --> MR as residual symptoms as well as catatonia  --> On Ambien 5mg  BID, Prednisone 40mg  QD, Imuran 150mg  QD for treatment Altered Mental Status Seizure Disorder -- on Keppra at Home  P:   - Continue home rx   Overall much improved.  Cont monitor in SDU today.     Danford Bad, NP 05/10/2013  10:04 AM Pager: [phone]  [phone] or [phone]  *Care during the described time interval was provided by me and/or other providers on the critical care team. I have reviewed this patient's available data, including medical history, events of note, physical examination and test results as part of my evaluation.    I have fully examined this patient and agree with above findings.    And edited in full  Mcarthur Rossetti. Tyson Alias, MD, FACP Pgr: [phone] [facility]

## 2013-05-10 NOTE — Progress Notes (Signed)
PT'S BLOOD SUGAR HAS DROPPED TWICE DURING DAY SHIFT. NURSE GAVE DEXTROSE TWO TIMES AND AFTER EACH DOSE PT' S BS CAME UP TO 150'S. PT HAS BEEN ASYMPTOMATIC. CALLED MD ON CALL IN BLACK BOX JUST NOW TO NOTIFY. MD STATED HE WOULD CHANGE PT'S FLUIDS BY PLACING NEW ORDERS.

## 2013-05-10 NOTE — Progress Notes (Signed)
CRITICAL VALUE ALERT  Critical value received:  BLOOD GLUCOSE 60  Date of notification:  12/10  Time of notification: NOW  Critical value read back:YES  Nurse who received alert: Santiaga Butzin RN   MD notified (1st page):  DR. SOOD  Time of first page:NOW  MD notified (2nd page):  Time of second page:  Responding MD:  DR. SOOD  Time MD responded:  NOW

## 2013-05-10 NOTE — Progress Notes (Signed)
INITIAL NUTRITION ASSESSMENT  DOCUMENTATION CODES Per approved criteria  -Not Applicable   INTERVENTION: Advance diet as medically appropriate, add interventions accordingly RD to follow for nutrition care plan  NUTRITION DIAGNOSIS: Inadequate oral intake related to inability to eat as evidenced by NPO status  Goal: Pt to meet >/= 90% of their estimated nutrition needs   Monitor:  PO diet advancement & intake, weight, labs, I/O's  Reason for Assessment: Malnutrition Screening Tool Report  37 y.o. female  Admitting Dx: altered baseline mentation  ASSESSMENT: Patient with PMH of limbic encephalitis, depression with catatonia and seizure disorder; required group home environment since 2 years ago; admitted with severe hypernatremia, UTI and altered mental status.  RD unable to interview patient; alert but non-responsive to RD questions; per H&P patient has had decreased oral intake for the past 2-3 weeks; currently NPO; weight is down since 05/05/13, however, RD questions accuracy; per Nursing Home note 12/5 patient with history of constipation (on Colace & Miralax).  Height: Ht Readings from Last 1 Encounters:  05/09/13 5\' 8"  (1.727 m)    Weight: Wt Readings from Last 1 Encounters:  05/10/13 141 lb 8.6 oz (64.2 kg)    Ideal Body Weight: 140 lb  % Ideal Body Weight: 99%  Wt Readings from Last 10 Encounters:  05/10/13 141 lb 8.6 oz (64.2 kg)  05/05/13 160 lb (72.576 kg)  03/24/13 163 lb (73.936 kg)  02/24/13 161 lb (73.029 kg)  12/16/12 162 lb (73.483 kg)  09/30/12 161 lb (73.029 kg)    Usual Body Weight: 161 lb  % Usual Body Weight: 87%  BMI:  Body mass index is 21.53 kg/(m^2).  Estimated Nutritional Needs: Kcal: 1600-1800 Protein: 80-90gm Fluid: 1.6-1.8 L  Skin: Intact  Diet Order: NPO  EDUCATION NEEDS: -No education needs identified at this time   Intake/Output Summary (Last 24 hours) at 05/10/13 1529 Last data filed at 05/10/13 0615  Gross per  24 hour  Intake   1506 ml  Output      0 ml  Net   1506 ml    Labs:   Recent Labs Lab 05/09/13 1308 05/09/13 2010  05/10/13 0509 05/10/13 0830 05/10/13 1341  NA 172* 171*  < > 166* 166* 162*  K 3.2* 3.4*  < > 3.4* 3.5 2.9*  CL >130* >130*  < > >130* >130* 128*  CO2 29 28  < > 28 28 28   BUN 29* 25*  < > 21 19 15   CREATININE 1.22* 1.21*  < > 1.02 1.11* 0.98  CALCIUM 9.4 8.6  < > 8.3* 8.4 8.4  MG  --  2.7*  --   --   --   --   PHOS  --  2.8  --   --   --   --   GLUCOSE 104* 80  < > 70 73 151*  < > = values in this interval not displayed.  CBG (last 3)   Recent Labs  05/10/13 0759 05/10/13 1154 05/10/13 1244  GLUCAP 86 65* 154*    Scheduled Meds: . cefTRIAXone (ROCEPHIN)  IV  1 g Intravenous Q24H  . famotidine (PEPCID) IV  20 mg Intravenous Q12H  . heparin  5,000 Units Subcutaneous Q8H  . insulin aspart  0-15 Units Subcutaneous Q4H    Continuous Infusions: . sodium chloride 120 mL/hr at 05/10/13 0101    Past Medical History  Diagnosis Date  . Hydrocephalus   . Muscle weakness (generalized)   . Seizures   .  Hypertension   . Limbic encephalitis 02/16/2013  . Anemia, unspecified 02/16/2013  . Catatonia 02/16/2013    Past Surgical History  Procedure Laterality Date  . Foot surgery  1990's  . Cesarean section  02/02/10    Maureen Chatters, RD, LDN Pager #: [phone] After-Hours Pager #: [phone]

## 2013-05-10 NOTE — Progress Notes (Addendum)
CSW called the number listed in the chart for pt's mother again, with the same result as earlier today: continuous ringing with no voicemail opportunity. CSW will try again tomorrow and will call [facility] to see if they have another number for mother.   Maryclare Labrador, MSW, [facility] Clinical Social Worker [phone]

## 2013-05-10 NOTE — Progress Notes (Signed)
eLink Physician-Brief Progress Note Patient Name: [name] Greene DOB: 09-03-1975 MRN: [id number]  Date of Service  05/10/2013   HPI/Events of Note  Hypokalemia   eICU Interventions  Potassium replaced   Intervention Category Intermediate Interventions: Electrolyte abnormality - evaluation and management  DETERDING,ELIZABETH 05/10/2013, 1:48 AM

## 2013-05-10 NOTE — Progress Notes (Signed)
CRITICAL VALUE ALERT  Critical value received:  Cbc   Date of notification:  12/10  Time of notification:   Critical value read back:  Nurse who received alert:  Rocky Link  MD notified (1st page):  Time of first page:    MD notified (2nd page):  Time of second page:  Responding MD:    Time MD responded:

## 2013-05-10 NOTE — Progress Notes (Signed)
Utilization review completed. Julietta Batterman, RN, BSN. 

## 2013-05-10 NOTE — Significant Event (Signed)
Pt with hypokalemia and hypoglycemia.  Hypernatremia improving.  Will change IV fluids to D5 1/2 NS with 20 KCL at129ml/hr.  Coralyn Helling, MD 05/10/2013, 5:42 PM

## 2013-05-10 NOTE — Progress Notes (Signed)
CRITICAL VALUE ALERT  Critical value received:  NA 166 AND CL >130  Date of notification:  05/10/13  Time of notification:  1016   Critical value read back:YES  Nurse who received alert:  Chyler Creely RN   MD notified (1st page)DR. FEINSTEIN  Time of first page:  1016  MD notified (2nd page):  Time of second page:  Responding MD:  Tyson Alias Time MD responded:  1019

## 2013-05-10 NOTE — Progress Notes (Signed)
CRITICAL VALUE ALERT  Critical value received: serum osmolality 357  Date of notification:  05/10/2013  Time of notification:  02:34  Critical value read back:yes  Nurse who received alert:  Lillia Corporal  MD notified (1st page):  Dr. Darrick Penna  Time of first page:  02:36  MD notified (2nd page):  Time of second page:  Responding MD:  Dr. Darrick Penna  Time MD responded:  02:37

## 2013-05-10 NOTE — Progress Notes (Signed)
Clinical Social Work Department BRIEF PSYCHOSOCIAL ASSESSMENT 05/10/2013  Patient:  SEMIRA, STOLTZFUS     Account Number:  [id number]     Admit date:  05/09/2013  Clinical Social Worker:  Varney Biles  Date/Time:  05/10/2013 01:30 PM  Referred by:  Physician  Date Referred:  05/10/2013 Referred for  SNF Placement   Other Referral:   Interview type:  Other - See comment Other interview type:   CSW received information to complete assessment from [facility] admissions rep.    PSYCHOSOCIAL DATA Living Status:  FACILITY Admitted from facility:  [facility] Level of care:  Skilled Nursing Facility Primary support name:  Camyla Camposano [phone]) Primary support relationship to patient:  PARENT Degree of support available:   Good--pt has been resident at [facility] since 05/23/12.    CURRENT CONCERNS Current Concerns  Post-Acute Placement   Other Concerns:    SOCIAL WORK ASSESSMENT / PLAN CSW called [name], as paperwork in pt's hard chart shows she was a resident at [facility]. [facility] confirmed pt is resident and has been since December 2013. Facility believes pt's mother might like to move the pt closer to their family home in [place]. CSW called pt's mother but was unable to leave a voicemail--phone continuously rings without option to leave message. CSW will re-attempt this afternoon, time permitting, or will try tomorrow. CSW verified with [facility] that they can take her back. CSW called financial counseling, as according to the chart pt is Medicaid potential. Left voicemail for financial counseling requesting they call CSW back after they look into insurance and see about the status of Medicaid application.   Assessment/plan status:  Psychosocial Support/Ongoing Assessment of Needs Other assessment/ plan:   Information/referral to community resources:   SNF    PATIENT'S/FAMILY'S RESPONSE TO PLAN OF CARE: Admissions rep  receptive to CSW phone call and provided all information above to complete this assessment. CSW actively attempting to reach pt's mother.

## 2013-05-10 NOTE — Progress Notes (Signed)
CRITICAL VALUE ALERT  Critical value received:  Na 171, Chloride <130  Date of notification:  03/09/13  Time of notification:  21:45  Critical value read back:yes  Nurse who received alert:  Lillia Corporal  MD notified (1st page):  Dr. Craige Cotta  Time of first page:  21:45  MD notified (2nd page):  Time of second page:  Responding MD:  Dr. Craige Cotta  Time MD responded:  21:55

## 2013-05-11 DIAGNOSIS — M6200 Separation of muscle (nontraumatic), unspecified site: Secondary | ICD-10-CM

## 2013-05-11 LAB — BASIC METABOLIC PANEL
BUN: 9 mg/dL (ref 6–23)
BUN: 7 mg/dL (ref 6–23)
CO2: 29 mEq/L (ref 19–32)
Calcium: 8.5 mg/dL (ref 8.4–10.5)
Chloride: 125 mEq/L — ABNORMAL HIGH (ref 96–112)
Chloride: 125 mEq/L — ABNORMAL HIGH (ref 96–112)
Creatinine, Ser: 0.99 mg/dL (ref 0.50–1.10)
Creatinine, Ser: 0.95 mg/dL (ref 0.50–1.10)
GFR calc Af Amer: 84 mL/min — ABNORMAL LOW (ref >90)
GFR calc Af Amer: 88 mL/min — ABNORMAL LOW (ref >90)
GFR calc Af Amer: >90 mL/min (ref >90)
GFR calc non Af Amer: 72 mL/min — ABNORMAL LOW (ref >90)
GFR calc non Af Amer: 84 mL/min — ABNORMAL LOW (ref >90)
Glucose, Bld: 165 mg/dL — ABNORMAL HIGH (ref 70–99)
Glucose, Bld: 101 mg/dL — ABNORMAL HIGH (ref 70–99)
Glucose, Bld: 95 mg/dL (ref 70–99)
Potassium: 3.6 mEq/L (ref 3.5–5.1)
Sodium: 157 mEq/L — ABNORMAL HIGH (ref 135–145)

## 2013-05-11 LAB — CBC
HCT: 36.4 % (ref 36.0–46.0)
MCH: 32.4 pg (ref 26.0–34.0)
MCHC: 31.6 g/dL (ref 30.0–36.0)
MCV: 102.5 fL — ABNORMAL HIGH (ref 78.0–100.0)
RBC: 3.55 MIL/uL — ABNORMAL LOW (ref 3.87–5.11)
RDW: 17.2 % — ABNORMAL HIGH (ref 11.5–15.5)

## 2013-05-11 LAB — GLUCOSE, CAPILLARY
Glucose-Capillary: 111 mg/dL — ABNORMAL HIGH (ref 70–99)
Glucose-Capillary: 61 mg/dL — ABNORMAL LOW (ref 70–99)
Glucose-Capillary: 73 mg/dL (ref 70–99)

## 2013-05-11 MED ORDER — VITAMIN D3 25 MCG (1000 UNIT) PO TABS
1000.0000 [IU] | ORAL_TABLET | Freq: Every day | ORAL | Status: DC
  Administered 2013-05-11 – 2013-05-15 (×5): 1000 [IU] via ORAL
  Filled 2013-05-11 (×5): qty 1

## 2013-05-11 MED ORDER — DOCUSATE SODIUM 100 MG PO CAPS
100.0000 mg | ORAL_CAPSULE | Freq: Two times a day (BID) | ORAL | Status: DC
  Administered 2013-05-11 – 2013-05-15 (×9): 100 mg via ORAL
  Filled 2013-05-11 (×11): qty 1

## 2013-05-11 MED ORDER — ZOLPIDEM TARTRATE 5 MG PO TABS
5.0000 mg | ORAL_TABLET | Freq: Two times a day (BID) | ORAL | Status: DC
  Administered 2013-05-11 – 2013-05-15 (×9): 5 mg via ORAL
  Filled 2013-05-11 (×9): qty 1

## 2013-05-11 MED ORDER — AZATHIOPRINE 50 MG PO TABS
150.0000 mg | ORAL_TABLET | Freq: Every day | ORAL | Status: DC
  Administered 2013-05-11 – 2013-05-15 (×5): 150 mg via ORAL
  Filled 2013-05-11 (×6): qty 3

## 2013-05-11 MED ORDER — DEXTROSE 5 % IV SOLN
INTRAVENOUS | Status: DC
  Administered 2013-05-11 – 2013-05-13 (×5): via INTRAVENOUS

## 2013-05-11 MED ORDER — DEXTROSE 50 % IV SOLN
25.0000 mL | Freq: Once | INTRAVENOUS | Status: AC | PRN
  Administered 2013-05-11: 25 mL via INTRAVENOUS
  Filled 2013-05-11: qty 50

## 2013-05-11 MED ORDER — LEVETIRACETAM 750 MG PO TABS
750.0000 mg | ORAL_TABLET | Freq: Two times a day (BID) | ORAL | Status: DC
  Administered 2013-05-11 – 2013-05-15 (×9): 750 mg via ORAL
  Filled 2013-05-11 (×10): qty 1

## 2013-05-11 MED ORDER — POTASSIUM CHLORIDE 10 MEQ/100ML IV SOLN
10.0000 meq | INTRAVENOUS | Status: DC
  Administered 2013-05-11: 10 meq via INTRAVENOUS
  Filled 2013-05-11 (×3): qty 100

## 2013-05-11 MED ORDER — POTASSIUM CHLORIDE 20 MEQ/15ML (10%) PO LIQD
40.0000 meq | ORAL | Status: AC
  Administered 2013-05-11 (×3): 40 meq
  Filled 2013-05-11 (×3): qty 30

## 2013-05-11 MED ORDER — FERROUS SULFATE 325 (65 FE) MG PO TABS
325.0000 mg | ORAL_TABLET | Freq: Every day | ORAL | Status: DC
  Administered 2013-05-12 – 2013-05-15 (×4): 325 mg via ORAL
  Filled 2013-05-11 (×5): qty 1

## 2013-05-11 MED ORDER — PREDNISOLONE SODIUM PHOSPHATE 15 MG/5ML PO SOLN
40.0000 mg | Freq: Every day | ORAL | Status: DC
  Administered 2013-05-12 – 2013-05-15 (×4): 40 mg via ORAL
  Filled 2013-05-11 (×7): qty 15

## 2013-05-11 NOTE — Progress Notes (Signed)
eLink Physician-Brief Progress Note Patient Name: Wendolyn Raso DOB: 09-05-1975 MRN: [id number]  Date of Service  05/11/2013   HPI/Events of Note  Hypokalemia  eICU Interventions  Potassium replaced   Intervention Category Intermediate Interventions: Electrolyte abnormality - evaluation and management  DETERDING,ELIZABETH 05/11/2013, 4:16 AM

## 2013-05-11 NOTE — Progress Notes (Signed)
CSW called phone number in chart for [name] Greene. Esperanza Sheets is pt's cousin. CSW requested phone number for pt's mother, [name]. Esperanza Sheets is calling [name] and will give [name] CSW's phone number, as [name] does not know her Aunt's phone number and will have to find it. CSW will follow up with [name] if she does not hear from her or [name] by 3:00pm.   Maryclare Labrador, MSW, [facility] Clinical Social Worker [phone]

## 2013-05-11 NOTE — Progress Notes (Signed)
PULMONARY  / CRITICAL CARE MEDICINE  Name: [name] Greene MRN: [id number] DOB: 01-Apr-1976    ADMISSION DATE:  05/09/2013 CONSULTATION DATE:  05/09/2013  REFERRING MD :  Dr. Leanord Hawking PRIMARY SERVICE: PCCM  CHIEF COMPLAINT:  Not Her Self, Altered Baseline Mentation  BRIEF PATIENT DESCRIPTION:  [name] Greene is a 37 year old AAF with Limbic Encephalitis diagnosed at [facility], 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    SIGNIFICANT EVENTS / STUDIES:  12/09 BMET with Na at 172, Cl >130, K at 3.2 and Scr. At 1.22 with baseline 0.77 12/09 CXR pending result 12/09 CBC with elevated WBC at 12.2 and hemoconcentration 15.1/47.4 12/09 UA with positive nitrite and WBC 11-25  LINES / TUBES: Peripheral IV 12/09 >>>  CULTURES: 12/09 Urine Culture >>> 12/09 MRSA PCR >>> Negative  ANTIBIOTICS: 12/09 Rocephin >>>  Subjective/Overnight  Much more alert, appropriate.  Hypoglycemia overnight.   VITAL SIGNS: Temp:  [98 F (36.7 C)-99.9 F (37.7 C)] 98.5 F (36.9 C) (12/11 0800) Pulse Rate:  [79-99] 88 (12/11 0800) Resp:  [12-17] 14 (12/11 0800) BP: (97-119)/(69-85) 97/69 mmHg (12/11 0800) SpO2:  [97 %-100 %] 97 % (12/11 0800) Weight:  [146 lb 6.2 oz (66.4 kg)] 146 lb 6.2 oz (66.4 kg) (12/11 0500)    INTAKE / OUTPUT: Intake/Output     12/10 0701 - 12/11 0700 12/11 0701 - 12/12 0700   I.V. (mL/kg) 2696 (40.6) 120 (1.8)   IV Piggyback 150    Total Intake(mL/kg) 2846 (42.9) 120 (1.8)   Net +2846 +120        Urine Occurrence 7 x 1 x     PHYSICAL EXAMINATION: General:  Chronically ill appearing female, NAD  Neuro: awake, oriented, slow to respond but appropriate, much improved, MAE HEENT:  Mm dry, no JVD  Cardiovascular:  Sinus tachycardia, no murmur/rub/gallop Lungs:  CTAB, no adventitious sounds anteriorly Abdomen: mildly distended, no  guarding, (+)BS, nontender Musculoskeletal:  Intact, no lesion, no ulcers, positive and equal pulses, no edema   LABS:  CBC  Recent Labs Lab 05/09/13 1308 05/10/13 0509 05/11/13 0241  WBC 12.2* 9.2 8.0  HGB 15.1* 12.3 11.5*  HCT 47.4* 39.8 36.4  PLT 175 139* 129*   Coag's No results found for this basename: APTT, INR,  in the last 168 hours BMET  Recent Labs Lab 05/10/13 1930 05/11/13 0241 05/11/13 0817  NA 160* 158* 160*  K 3.8 3.1* 3.1*  CL 129* 125* 125*  CO2 26 27 29   BUN 12 9 7   CREATININE 0.95 0.99 0.95  GLUCOSE 131* 165* 101*   Electrolytes  Recent Labs Lab 05/09/13 2010  05/10/13 1930 05/11/13 0241 05/11/13 0817  CALCIUM 8.6  < > 8.2* 8.4 8.4  MG 2.7*  --   --   --   --   PHOS 2.8  --   --   --   --   < > = values in this interval not displayed. Sepsis Markers  Recent Labs Lab 05/09/13 1616  LATICACIDVEN 1.5   ABG No results found for this basename: PHART, PCO2ART, PO2ART,  in the last 168 hours Liver Enzymes No results found for this basename: AST, ALT, ALKPHOS, BILITOT, ALBUMIN,  in the last 168 hours Cardiac Enzymes No results found for this basename: TROPONINI, PROBNP,  in the last 168 hours Glucose  Recent Labs Lab 05/10/13 1244 05/10/13  1643 05/10/13 1731 05/10/13 2133 05/11/13 0044 05/11/13 0513  GLUCAP 154* 60* 150* 83 61* 111*    Imaging Dg Chest Port 1 View  05/10/2013   CLINICAL DATA:  Shortness of breath.  EXAM: PORTABLE CHEST - 1 VIEW  COMPARISON:  05/09/2013 and 10/20/2012  FINDINGS: Cardiomegaly, unchanged. Pulmonary vascularity is normal. Lungs are clear. No effusions. Severe thoracolumbar scoliosis.  IMPRESSION: No acute abnormality.   Electronically Signed   By: Geanie Cooley M.D.   On: 05/10/2013 07:20   Dg Chest Port 1 View  05/09/2013   CLINICAL DATA:  Evaluate for airspace disease, hydrocephalus, seizure disorder  EXAM: PORTABLE CHEST - 1 VIEW  COMPARISON:  10/20/2012  FINDINGS: Chronic S-shaped scoliosis of  the thoracic and lumbar spine. Mild cardiac enlargement. Lungs remain clear. No focal pneumonia, collapse or consolidation. No effusion or pneumothorax. No acute osseous finding.  IMPRESSION: Cardiomegaly without acute process.  Stable exam.   Electronically Signed   By: Ruel Favors M.D.   On: 05/09/2013 17:13     CXR:  12/11 - no new CXR    ASSESSMENT / PLAN:  PULMONARY A: No Acute Process  P:   - No intervention - wean off O2 as able  - pulm hygiene   CARDIOVASCULAR A:  Sinus Tachycardia  - Resolved.  P:  - cont gentle volume  - tele   RENAL A:   Severe Hypernatremia Severe Hyperchloremia Hypokalemia  Acute Kidney Injury (hypovolemia, atn) Dehydration  P:   - Will correct sodium no more than 10-72mEq/day - change MIVF to d5w have NA rise last night with 12 NS - serial BMET - change to q12 -replete K - done 12/11  GASTROINTESTINAL A:   No active issue  P:   - No intervention - Encourage PO intake  HEMATOLOGIC A:   Hemoconcentration - improved   --> Likely due to dehyration P:  - IVF correction as above under renal section - f/u cbc   INFECTIOUS A:   UTI P:   - f/u urine culture  - Cont Rocephin for now, likely needs 7 days  ENDOCRINE A:   Hyperglycemia Hypoglycemia  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, unclear etiology  --> MR as residual symptoms as well as catatonia  --> On Ambien 5mg  BID, Prednisone 40mg  QD, Imuran 150mg  QD for treatment Altered Mental Status Seizure Disorder -- on Keppra at Home  P:   - Continue home rx -correct NA further   Overall much improved.  Tx to tele and [facility] to assume care 12/12.    Danford Bad, NP 05/11/2013  9:18 AM Pager: [phone] or [phone]  *Care during the described time interval was provided  by me and/or other providers on the critical care team. I have reviewed this patient's available data, including medical history, events of note, physical examination and test results as part of my evaluation.  I have fully examined this patient and agree with above findings.    And edited in full  Mcarthur Rossetti. Tyson Alias, MD, FACP Pgr: [phone] [facility]

## 2013-05-11 NOTE — Progress Notes (Signed)
To contact pt's mother, Wendi Maya, cell: [phone]. CSW provided new CSW a handoff, as pt has transferred to 6E. This CSW signing off.   Maryclare Labrador, MSW, [facility] Clinical Social Worker [phone]

## 2013-05-11 NOTE — ED Provider Notes (Signed)
Medical screening examination/treatment/procedure(s) were conducted as a shared visit with non-physician practitioner(s) and myself.  I personally evaluated the patient during the encounter.  EKG Interpretation    Date/Time:    Ventricular Rate:    PR Interval:    QRS Duration:   QT Interval:    QTC Calculation:   R Axis:     Text Interpretation:             PT with hx of encephalopathy comes in with cc of AMS. Pt is noted to have a Na+ of 172. Likely explains the AMS. Mother reports that patient has had very poor intake, to no intake the last 1 week. I am favoring the etiology for the hypernatremia to be dehydration. There is no way we can get a proper neuro exam, but i doubt that this is a pituitary tumor. Plan is to get CCM on board for slow correction of the hypernatremia. CT head and other testing deferred for the admitting team.  CRITICAL CARE Performed by: Derwood Kaplan   Total critical care time: 35 minutes  Critical care time was exclusive of separately billable procedures and treating other patients.  Critical care was necessary to treat or prevent imminent or life-threatening deterioration.  Critical care was time spent personally by me on the following activities: development of treatment plan with patient and/or surrogate as well as nursing, discussions with consultants, evaluation of patient's response to treatment, examination of patient, obtaining history from patient or surrogate, ordering and performing treatments and interventions, ordering and review of laboratory studies, ordering and review of radiographic studies, pulse oximetry and re-evaluation of patient's condition.   Derwood Kaplan, MD 05/11/13 2017

## 2013-05-11 NOTE — Progress Notes (Signed)
CSW received call from pt's mother stating mother would like to take pt home. CSW explained that if mother has had this conversation with the pt and that the pt is oriented and can make this decision, pt can go home with her mother. CSW will also inform RNCM that this is what pt's mother would like to do. CSW also encouraged mother to call [facility], [facility] that [facility] came from, to inform them of her plan.   Maryclare Labrador, MSW, [facility] Clinical Social Worker [phone]

## 2013-05-11 NOTE — Progress Notes (Signed)
[id number] Patient transfer from 2c to 6east at 1430. she is alert, oriented to person and place, but not time. It was reported that patient was bed bound and uses a wheelchair . It was reported to me by patient cousin that she can walk, and she uses a wheelchair at the facility, also reported that patient had fallen out her wheelchair at the facility. Patient skin is dry, but no breakdown noted. Patient was place on telemetry. [facility] RN.

## 2013-05-12 DIAGNOSIS — G0490 Encephalitis and encephalomyelitis, unspecified: Secondary | ICD-10-CM

## 2013-05-12 DIAGNOSIS — R569 Unspecified convulsions: Secondary | ICD-10-CM

## 2013-05-12 DIAGNOSIS — A498 Other bacterial infections of unspecified site: Secondary | ICD-10-CM

## 2013-05-12 DIAGNOSIS — E870 Hyperosmolality and hypernatremia: Secondary | ICD-10-CM | POA: Diagnosis present

## 2013-05-12 DIAGNOSIS — E860 Dehydration: Secondary | ICD-10-CM

## 2013-05-12 LAB — GLUCOSE, CAPILLARY
Glucose-Capillary: 121 mg/dL — ABNORMAL HIGH (ref 70–99)
Glucose-Capillary: 98 mg/dL (ref 70–99)
Glucose-Capillary: 98 mg/dL (ref 70–99)

## 2013-05-12 LAB — BASIC METABOLIC PANEL
BUN: 7 mg/dL (ref 6–23)
CO2: 28 mEq/L (ref 19–32)
Calcium: 8.5 mg/dL (ref 8.4–10.5)
Chloride: 123 mEq/L — ABNORMAL HIGH (ref 96–112)
Creatinine, Ser: 1.01 mg/dL (ref 0.50–1.10)
GFR calc non Af Amer: 71 mL/min — ABNORMAL LOW (ref >90)
Glucose, Bld: 93 mg/dL (ref 70–99)

## 2013-05-12 LAB — CBC
HCT: 36.2 % (ref 36.0–46.0)
MCH: 32.9 pg (ref 26.0–34.0)
MCV: 100 fL (ref 78.0–100.0)
RBC: 3.62 MIL/uL — ABNORMAL LOW (ref 3.87–5.11)
RDW: 16.1 % — ABNORMAL HIGH (ref 11.5–15.5)
WBC: 6.2 10*3/uL (ref 4.0–10.5)

## 2013-05-12 LAB — URINE CULTURE

## 2013-05-12 MED ORDER — FAMOTIDINE 20 MG PO TABS
20.0000 mg | ORAL_TABLET | Freq: Two times a day (BID) | ORAL | Status: DC
  Administered 2013-05-12 – 2013-05-15 (×6): 20 mg via ORAL
  Filled 2013-05-12 (×8): qty 1

## 2013-05-12 MED ORDER — CIPROFLOXACIN HCL 500 MG PO TABS
500.0000 mg | ORAL_TABLET | Freq: Two times a day (BID) | ORAL | Status: DC
  Administered 2013-05-12 – 2013-05-15 (×7): 500 mg via ORAL
  Filled 2013-05-12 (×8): qty 1

## 2013-05-12 MED ORDER — ENSURE PUDDING PO PUDG
1.0000 | Freq: Three times a day (TID) | ORAL | Status: DC
  Administered 2013-05-13 – 2013-05-15 (×8): 1 via ORAL

## 2013-05-12 MED ORDER — INSULIN ASPART 100 UNIT/ML ~~LOC~~ SOLN: 0.0000 [IU] | Freq: Three times a day (TID) | SUBCUTANEOUS | Status: DC

## 2013-05-12 NOTE — Clinical Social Work Note (Signed)
CSW rec'd call from Marylene Land in admissions with [name] Greene to get medical status update on patient.  CSW informed Marylene Land that mother wants to take patient home and this is concerning to admissions staff. CSW informed that patient's mother has her own health concerns and is very forgetful. 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 CSW was given name and contact information for patient's brother: Jonny Ruiz - [phone].   CSW will continue to monitor patient's progress and make contact with family regarding discharge plans.  Genelle Bal, MSW, LCSW [phone]

## 2013-05-12 NOTE — Progress Notes (Signed)
Chart reviewed.   [facility] PROGRESS NOTE  [name] Greene JYN:[id number] DOB: March 15, 1976 DOA: 05/09/2013 PCP: Terald Sleeper, MD  Assessment/Plan:    E. coli UTI: resistant to ceftriaxone. Change to cipro.   Hypernatremia: continue hypotonic IVF. Increase rate   Dehydration   Limbic encephalitis   Seizures  Code Status:  full Family Communication:   Disposition Plan:  home  Consultants:    Procedures:     Antibiotics:    HPI/Subjective: Denies pain, nausea. Per RN, speaks intermittently. Eating and taking meds periodically  Objective: Filed Vitals:   05/12/13 1746  BP: 96/72  Pulse: 99  Temp: 97.7 F (36.5 C)  Resp: 18    Intake/Output Summary (Last 24 hours) at 05/12/13 2235 Last data filed at 05/12/13 1940  Gross per 24 hour  Intake   2010 ml  Output      0 ml  Net   2010 ml   Filed Weights   05/11/13 0500 05/11/13 1229 05/11/13 2008  Weight: 66.4 kg (146 lb 6.2 oz) 67 kg (147 lb 11.3 oz) 67.178 kg (148 lb 1.6 oz)    Exam:   General:  Watching TV in the dark. Answers by nodding yes, no appropriately  Cardiovascular: RRR without MGR  Respiratory: CTA without WRR  Abdomen: S, NT, ND  Ext: no CCE  Basic Metabolic Panel:  Recent Labs Lab 05/09/13 1308 05/09/13 2010  05/10/13 1930 05/11/13 0241 05/11/13 0817 05/11/13 1735 05/12/13 0516  NA 172* 171*  < > 160* 158* 160* 157* 159*  K 3.2* 3.4*  < > 3.8 3.1* 3.1* 3.6 3.5  CL >130* >130*  < > 129* 125* 125* 122* 123*  CO2 29 28  < > 26 27 29 29 28   GLUCOSE 104* 80  < > 131* 165* 101* 95 93  BUN 29* 25*  < > 12 9 7  4* 7  CREATININE 1.22* 1.21*  < > 0.95 0.99 0.95 0.88 1.01  CALCIUM 9.4 8.6  < > 8.2* 8.4 8.4 8.5 8.5  MG  --  2.7*  --   --   --   --   --   --   PHOS  --  2.8  --   --   --   --   --   --   < > = values in this interval not displayed. Liver Function Tests: No results found for this basename: AST, ALT, ALKPHOS, BILITOT, PROT, ALBUMIN,  in the last 168  hours No results found for this basename: LIPASE, AMYLASE,  in the last 168 hours No results found for this basename: AMMONIA,  in the last 168 hours CBC:  Recent Labs Lab 05/09/13 1308 05/10/13 0509 05/11/13 0241 05/12/13 0516  WBC 12.2* 9.2 8.0 6.2  NEUTROABS 9.6*  --   --   --   HGB 15.1* 12.3 11.5* 11.9*  HCT 47.4* 39.8 36.4 36.2  MCV 104.6* 105.3* 102.5* 100.0  PLT 175 139* 129* 123*   Cardiac Enzymes: No results found for this basename: CKTOTAL, CKMB, CKMBINDEX, TROPONINI,  in the last 168 hours BNP (last 3 results) No results found for this basename: PROBNP,  in the last 8760 hours CBG:  Recent Labs Lab 05/12/13 0417 05/12/13 0745 05/12/13 1140 05/12/13 1630 05/12/13 1956  GLUCAP 92 98 121* 167* 139*    Recent Results (from the past 240 hour(s))  URINE CULTURE     Status: None   Collection Time    05/09/13  1:51 PM  Result Value Range Status   Specimen Description URINE, CATHETERIZED   Final   Special Requests NONE   Final   Culture  Setup Time     Final   Value: 05/09/2013 20:35     Performed at [facility]   Colony Count     Final   Value: >=100,000 COLONIES/ML     Performed at [facility]   Culture     Final   Value: ESCHERICHIA COLI     Note: Two isolates with different morphologies were identified as the same organism.The most resistant organism was reported.     Performed at [facility]   Report Status 05/12/2013 FINAL   Final   Organism ID, Bacteria ESCHERICHIA COLI   Final  MRSA PCR SCREENING     Status: None   Collection Time    05/09/13  9:21 PM      Result Value Range Status   MRSA by PCR NEGATIVE  NEGATIVE Final   Comment:            The GeneXpert MRSA Assay (FDA     approved for NASAL specimens     only), is one component of a     comprehensive MRSA colonization     surveillance program. It is not     intended to diagnose MRSA     infection nor to guide or     monitor treatment for     MRSA  infections.     Studies: No results found.  Scheduled Meds: . antiseptic oral rinse  15 mL Mouth Rinse BID  . azaTHIOprine  150 mg Oral Daily  . cholecalciferol  1,000 Units Oral Daily  . ciprofloxacin  500 mg Oral BID  . docusate sodium  100 mg Oral BID  . famotidine  20 mg Oral BID  . ferrous sulfate  325 mg Oral Q breakfast  . heparin  5,000 Units Subcutaneous Q8H  . [START ON 05/13/2013] insulin aspart  0-15 Units Subcutaneous TID WC  . levETIRAcetam  750 mg Oral Q12H  . prednisoLONE  40 mg Oral QAC breakfast  . zolpidem  5 mg Oral BID   Continuous Infusions: . dextrose 125 mL/hr at 05/12/13 2016    Time spent: 35 minutes  Derrin Currey L  [facility] Pager [phone]. If 7PM-7AM, please contact night-coverage at [email or web address], password [facility] 05/12/2013, 10:35 PM  LOS: 3 days

## 2013-05-13 LAB — BASIC METABOLIC PANEL
BUN: 7 mg/dL (ref 6–23)
BUN: 6 mg/dL (ref 6–23)
CO2: 26 mEq/L (ref 19–32)
CO2: 22 mEq/L (ref 19–32)
Calcium: 8.5 mg/dL (ref 8.4–10.5)
Calcium: 8.2 mg/dL — ABNORMAL LOW (ref 8.4–10.5)
Chloride: 108 mEq/L (ref 96–112)
Creatinine, Ser: 0.78 mg/dL (ref 0.50–1.10)
Creatinine, Ser: 0.75 mg/dL (ref 0.50–1.10)
Glucose, Bld: 113 mg/dL — ABNORMAL HIGH (ref 70–99)
Glucose, Bld: 120 mg/dL — ABNORMAL HIGH (ref 70–99)
Sodium: 146 mEq/L — ABNORMAL HIGH (ref 135–145)
Sodium: 143 mEq/L (ref 135–145)

## 2013-05-13 MED ORDER — POTASSIUM CHLORIDE CRYS ER 10 MEQ PO TBCR
10.0000 meq | EXTENDED_RELEASE_TABLET | Freq: Three times a day (TID) | ORAL | Status: AC
  Administered 2013-05-13 – 2013-05-14 (×6): 10 meq via ORAL
  Filled 2013-05-13 (×6): qty 1

## 2013-05-13 NOTE — Evaluation (Signed)
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   Clinical Impression  Patient presents with decreased balance limiting independence with mobility.  Per chart, appears mother would like to take patient home versus returning to group home.  Patient will need assistance for all mobility due to decreased balance.  Unsure of patient's cognitive status.  Patient did demonstrate some impulsivity during session and thus may require 24 hours supervisor to ensure she does not attempt mobility without assistance.  Patient will benefit from continued PT to address balance deficits and increase independence with mobility.    PT Assessment  Patient needs continued PT services    Follow Up Recommendations  Home health PT    Does the patient have the potential to tolerate intense rehabilitation      Barriers to Discharge        Equipment Recommendations  Rolling walker with 5" wheels    Recommendations for Other Services OT consult   Frequency Min 3X/week    Precautions / Restrictions Precautions Precautions: Fall   Pertinent Vitals/Pain Denies pain      Mobility  Bed Mobility Bed Mobility: Supine to Sit Supine to Sit: 4: Min assist Details for Bed Mobility Assistance: assistance to raise shoulders off bed; required directions for sequencing Transfers Transfers: Sit to Stand;Stand to Sit Sit to Stand: 4: Min assist Stand to Sit: 4: Min assist Details for Transfer Assistance: assistance for balance and to control  descent Ambulation/Gait Ambulation/Gait Assistance: 4: Min guard Ambulation Distance (Feet): 200 Feet Assistive device: Rolling walker Ambulation/Gait Assistance Details: patient unsteady during gait requiring min guard assistance; tended to lean forward during gait - always appears slightly off balance throughout gait Gait Pattern: Step-through pattern;Trunk flexed General Gait Details: attempted ambulation without RW.  Patient required min- mod assist due to loss of balance.  patient with loss of balance several times in 10', requiring assistance to regain    Exercises     PT Diagnosis: Generalized weakness  PT Problem List: Decreased range of motion;Decreased strength;Decreased activity tolerance;Decreased balance;Decreased mobility;Decreased safety awareness PT Treatment Interventions: DME instruction;Gait training;Functional mobility training;Therapeutic activities;Therapeutic exercise;Stair training;Balance training;Patient/family education     PT Goals(Current goals can be found in the care plan section) Acute Rehab PT Goals Patient Stated Goal: none stated PT Goal Formulation: Patient unable to participate in goal setting Time For Goal Achievement: 05/27/13 Potential to Achieve Goals: Good  Visit Information  Last PT Received On: 05/13/13 Assistance Needed: +1 History of Present Illness: Patient with h/o Limbic Encephalitis diagnosed at [facility], 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        Prior Functioning  Home Living Family/patient expects to be discharged to:: Private residence Living Arrangements: Parent Additional Comments: per chart patient was at group home and mother now wants to take patient home to her house.  Unsure of living environment as mother not here and patient unsure. Prior Function Level of  Independence: Needs assistance Gait / Transfers Assistance Needed: per chart, patient was mostly w/c  bound at group home.  Documented that mother states patient can ambulate Communication Communication: No difficulties (patient not very talkative, clear when she does speak)    Cognition  Cognition Arousal/Alertness: Awake/alert Behavior During Therapy: Impulsive Overall Cognitive Status: Impaired/Different from baseline Area of Impairment: Safety/judgement Safety/Judgement: Decreased awareness of safety General Comments: attempting to stand before therapist guarding even after I instructed her to wait    Extremity/Trunk Assessment Upper Extremity Assessment Upper Extremity Assessment: Defer to OT evaluation Lower Extremity Assessment Lower Extremity Assessment: RLE deficits/detail;LLE deficits/detail RLE Deficits / Details: limited ROM knee extension; at least 3/5 strength for hip flexion, knee extension and ankle dorsiflexion.  Difficult to test further as patient had difficulty following instructions RLE: Unable to fully assess due to pain LLE Deficits / Details: limited ROM knee extension; at least 3/5 strength for hip flexion, knee extension and ankle dorsiflexion.  Difficult to test further as patient had difficulty following instructions   Balance Balance Balance Assessed: Yes Static Sitting Balance Static Sitting - Balance Support: No upper extremity supported Static Sitting - Level of Assistance: 7: Independent Static Standing Balance Static Standing - Balance Support: No upper extremity supported Static Standing - Level of Assistance: 5: Stand by assistance Static Standing - Comment/# of Minutes: 1 min; gave slight perturbation and patient with loss of balance posteriorly, unable to self-regain  End of Session PT - End of Session Equipment Utilized During Treatment: Gait belt Activity Tolerance: Patient tolerated treatment well Patient left: in chair;with chair alarm  set;with call bell/phone within reach Nurse Communication: Mobility status;Other (comment) (chair alarm set)  GP     Olivia Canter, [place] [phone] 05/13/2013, 11:38 AM

## 2013-05-13 NOTE — Progress Notes (Addendum)
[facility] PROGRESS NOTE  [name] Greene ZOX:[id number] DOB: [birth year] DOA: 05/09/2013 PCP: Terald Sleeper, MD  Assessment/Plan:    E. coli UTI: cont cipro for 5 days.   Hypernatremia: resolved. Saline lock and monitor.  Baseline mental status unknown, but ?at baseline?  Will discuss dispo with social work. Per RN, gait very unsteady.   Dehydration   Limbic encephalitis   Seizures  Code Status:  full Family Communication:   Disposition Plan:  home  Consultants:    Procedures:     Antibiotics:    HPI/Subjective: Denies pain, nausea. Per RN, speaks intermittently, and able to feed self  Objective: Filed Vitals:   05/13/13 0920  BP: 115/79  Pulse: 85  Temp: 98.1 F (36.7 C)  Resp: 20    Intake/Output Summary (Last 24 hours) at 05/13/13 1329 Last data filed at 05/13/13 0900  Gross per 24 hour  Intake   2240 ml  Output      0 ml  Net   2240 ml   Filed Weights   05/11/13 1229 05/11/13 2008 05/12/13 2253  Weight: 67 kg (147 lb 11.3 oz) 67.178 kg (148 lb 1.6 oz) 70.58 kg (155 lb 9.6 oz)    Exam:   General:  In chair, watching TV, eating lunch. Communicates with yes/no, gestures appropriately  Cardiovascular: RRR without MGR  Respiratory: CTA without WRR  Abdomen: S, NT, ND  Ext: no CCE  Basic Metabolic Panel:  Recent Labs Lab 05/09/13 1308 05/09/13 2010  05/11/13 0817 05/11/13 1735 05/12/13 0516 05/13/13 0633 05/13/13 1003  NA 172* 171*  < > 160* 157* 159* 146* 143  K 3.2* 3.4*  < > 3.1* 3.6 3.5 3.2* 4.7  CL >130* >130*  < > 125* 122* 123* 110 108  CO2 29 28  < > 29 29 28 26 22   GLUCOSE 104* 80  < > 101* 95 93 113* 120*  BUN 29* 25*  < > 7 4* 7 7 6   CREATININE 1.22* 1.21*  < > 0.95 0.88 1.01 0.78 0.75  CALCIUM 9.4 8.6  < > 8.4 8.5 8.5 8.5 8.2*  MG  --  2.7*  --   --   --   --   --   --   PHOS  --  2.8  --   --   --   --   --   --   < > = values in this interval not displayed. Liver Function Tests: No results found for  this basename: AST, ALT, ALKPHOS, BILITOT, PROT, ALBUMIN,  in the last 168 hours No results found for this basename: LIPASE, AMYLASE,  in the last 168 hours No results found for this basename: AMMONIA,  in the last 168 hours CBC:  Recent Labs Lab 05/09/13 1308 05/10/13 0509 05/11/13 0241 05/12/13 0516  WBC 12.2* 9.2 8.0 6.2  NEUTROABS 9.6*  --   --   --   HGB 15.1* 12.3 11.5* 11.9*  HCT 47.4* 39.8 36.4 36.2  MCV 104.6* 105.3* 102.5* 100.0  PLT 175 139* 129* 123*   Cardiac Enzymes: No results found for this basename: CKTOTAL, CKMB, CKMBINDEX, TROPONINI,  in the last 168 hours BNP (last 3 results) No results found for this basename: PROBNP,  in the last 8760 hours CBG:  Recent Labs Lab 05/12/13 1140 05/12/13 1630 05/12/13 1956 05/12/13 2346 05/13/13 0356  GLUCAP 121* 167* 139* 130* 125*    Recent Results (from the past 240 hour(s))  URINE CULTURE  Status: None   Collection Time    05/09/13  1:51 PM      Result Value Range Status   Specimen Description URINE, CATHETERIZED   Final   Special Requests NONE   Final   Culture  Setup Time     Final   Value: 05/09/2013 20:35     Performed at [facility] Count     Final   Value: >=100,000 COLONIES/ML     Performed at [facility]   Culture     Final   Value: ESCHERICHIA COLI     Note: Two isolates with different morphologies were identified as the same organism.The most resistant organism was reported.     Performed at [facility]   Report Status 05/12/2013 FINAL   Final   Organism ID, Bacteria ESCHERICHIA COLI   Final  MRSA PCR SCREENING     Status: None   Collection Time    05/09/13  9:21 PM      Result Value Range Status   MRSA by PCR NEGATIVE  NEGATIVE Final   Comment:            The GeneXpert MRSA Assay (FDA     approved for NASAL specimens     only), is one component of a     comprehensive MRSA colonization     surveillance program. It is not     intended to diagnose  MRSA     infection nor to guide or     monitor treatment for     MRSA infections.     Studies: No results found.  Scheduled Meds: . antiseptic oral rinse  15 mL Mouth Rinse BID  . azaTHIOprine  150 mg Oral Daily  . cholecalciferol  1,000 Units Oral Daily  . ciprofloxacin  500 mg Oral BID  . docusate sodium  100 mg Oral BID  . famotidine  20 mg Oral BID  . feeding supplement (ENSURE)  1 Container Oral TID BM  . ferrous sulfate  325 mg Oral Q breakfast  . heparin  5,000 Units Subcutaneous Q8H  . levETIRAcetam  750 mg Oral Q12H  . potassium chloride  10 mEq Oral TID  . prednisoLONE  40 mg Oral QAC breakfast  . zolpidem  5 mg Oral BID   Continuous Infusions:    Time spent: 25 minutes  [name] Greene  [facility] Pager [phone]. If 7PM-7AM, please contact night-coverage at [email or web address], password [facility] 05/13/2013, 1:29 PM  LOS: 4 days

## 2013-05-14 LAB — BASIC METABOLIC PANEL
BUN: 10 mg/dL (ref 6–23)
Calcium: 8.5 mg/dL (ref 8.4–10.5)
Chloride: 113 mEq/L — ABNORMAL HIGH (ref 96–112)
Creatinine, Ser: 0.79 mg/dL (ref 0.50–1.10)
GFR calc non Af Amer: >90 mL/min (ref >90)
Glucose, Bld: 106 mg/dL — ABNORMAL HIGH (ref 70–99)
Sodium: 148 mEq/L — ABNORMAL HIGH (ref 135–145)

## 2013-05-14 MED ORDER — DEXTROSE 5 % IV SOLN
INTRAVENOUS | Status: DC
  Administered 2013-05-14 (×2): via INTRAVENOUS

## 2013-05-14 NOTE — Progress Notes (Addendum)
[facility] PROGRESS NOTE  [name] Greene ZOX:[id number] DOB: 04/26/1976 DOA: 05/09/2013 PCP: Terald Sleeper, MD  Assessment/Plan:    E. coli UTI: cont cipro for 5 days.   Hypernatremia: slightly up today. Will resume hypotonic fluid and discharge tomorrow if stable.  Will go back to SNF   Dehydration   Limbic encephalitis   Seizures  Code Status:  full Family Communication:   Disposition Plan:  SNF  Consultants:    Procedures:     Antibiotics:    HPI/Subjective: Denies pain, nausea. Feeding self lunch  Objective: Filed Vitals:   05/14/13 1000  BP: 110/71  Pulse: 91  Temp: 98 F (36.7 C)  Resp: 20    Intake/Output Summary (Last 24 hours) at 05/14/13 1345 Last data filed at 05/14/13 0900  Gross per 24 hour  Intake    480 ml  Output      0 ml  Net    480 ml   Filed Weights   05/11/13 2008 05/12/13 2253 05/13/13 2038  Weight: 67.178 kg (148 lb 1.6 oz) 70.58 kg (155 lb 9.6 oz) 67.631 kg (149 lb 1.6 oz)    Exam:   General:  In bed. Watching TV, eating lunch  Cardiovascular: RRR without MGR  Respiratory: CTA without WRR  Abdomen: S, NT, ND  Ext: no CCE  Basic Metabolic Panel:  Recent Labs Lab 05/09/13 1308 05/09/13 2010  05/11/13 1735 05/12/13 0516 05/13/13 0633 05/13/13 1003 05/14/13 0600  NA 172* 171*  < > 157* 159* 146* 143 148*  K 3.2* 3.4*  < > 3.6 3.5 3.2* 4.7 3.8  CL >130* >130*  < > 122* 123* 110 108 113*  CO2 29 28  < > 29 28 26 22 25   GLUCOSE 104* 80  < > 95 93 113* 120* 106*  BUN 29* 25*  < > 4* 7 7 6 10   CREATININE 1.22* 1.21*  < > 0.88 1.01 0.78 0.75 0.79  CALCIUM 9.4 8.6  < > 8.5 8.5 8.5 8.2* 8.5  MG  --  2.7*  --   --   --   --   --   --   PHOS  --  2.8  --   --   --   --   --   --   < > = values in this interval not displayed. Liver Function Tests: No results found for this basename: AST, ALT, ALKPHOS, BILITOT, PROT, ALBUMIN,  in the last 168 hours No results found for this basename: LIPASE, AMYLASE,  in  the last 168 hours No results found for this basename: AMMONIA,  in the last 168 hours CBC:  Recent Labs Lab 05/09/13 1308 05/10/13 0509 05/11/13 0241 05/12/13 0516  WBC 12.2* 9.2 8.0 6.2  NEUTROABS 9.6*  --   --   --   HGB 15.1* 12.3 11.5* 11.9*  HCT 47.4* 39.8 36.4 36.2  MCV 104.6* 105.3* 102.5* 100.0  PLT 175 139* 129* 123*   Cardiac Enzymes: No results found for this basename: CKTOTAL, CKMB, CKMBINDEX, TROPONINI,  in the last 168 hours BNP (last 3 results) No results found for this basename: PROBNP,  in the last 8760 hours CBG:  Recent Labs Lab 05/12/13 1140 05/12/13 1630 05/12/13 1956 05/12/13 2346 05/13/13 0356  GLUCAP 121* 167* 139* 130* 125*    Recent Results (from the past 240 hour(s))  URINE CULTURE     Status: None   Collection Time    05/09/13  1:51 PM  Result Value Range Status   Specimen Description URINE, CATHETERIZED   Final   Special Requests NONE   Final   Culture  Setup Time     Final   Value: 05/09/2013 20:35     Performed at [facility]   Colony Count     Final   Value: >=100,000 COLONIES/ML     Performed at [facility]   Culture     Final   Value: ESCHERICHIA COLI     Note: Two isolates with different morphologies were identified as the same organism.The most resistant organism was reported.     Performed at [facility]   Report Status 05/12/2013 FINAL   Final   Organism ID, Bacteria ESCHERICHIA COLI   Final  MRSA PCR SCREENING     Status: None   Collection Time    05/09/13  9:21 PM      Result Value Range Status   MRSA by PCR NEGATIVE  NEGATIVE Final   Comment:            The GeneXpert MRSA Assay (FDA     approved for NASAL specimens     only), is one component of a     comprehensive MRSA colonization     surveillance program. It is not     intended to diagnose MRSA     infection nor to guide or     monitor treatment for     MRSA infections.     Studies: No results found.  Scheduled Meds: .  antiseptic oral rinse  15 mL Mouth Rinse BID  . azaTHIOprine  150 mg Oral Daily  . cholecalciferol  1,000 Units Oral Daily  . ciprofloxacin  500 mg Oral BID  . docusate sodium  100 mg Oral BID  . famotidine  20 mg Oral BID  . feeding supplement (ENSURE)  1 Container Oral TID BM  . ferrous sulfate  325 mg Oral Q breakfast  . heparin  5,000 Units Subcutaneous Q8H  . levETIRAcetam  750 mg Oral Q12H  . potassium chloride  10 mEq Oral TID  . prednisoLONE  40 mg Oral QAC breakfast  . zolpidem  5 mg Oral BID   Continuous Infusions: . dextrose 100 mL/hr at 05/14/13 0755    Time spent: 15 minutes  [name]  [facility] Pager [phone]. If 7PM-7AM, please contact night-coverage at [email or web address], password [facility] 05/14/2013, 1:45 PM  LOS: 5 days

## 2013-05-14 NOTE — Progress Notes (Signed)
CSW spoke with pt's mother about discharge planning.  Pt's mother wants to take pt home, however understands it's in the best interest for pt to return to SNF at this time.  CSW will continue to follow to assist with discharge planning when pt medically stable.   [phone] (weekend CSW)

## 2013-05-15 LAB — BASIC METABOLIC PANEL
BUN: 10 mg/dL (ref 6–23)
CO2: 25 mEq/L (ref 19–32)
Calcium: 8.7 mg/dL (ref 8.4–10.5)
Calcium: 8.6 mg/dL (ref 8.4–10.5)
Chloride: 108 mEq/L (ref 96–112)
Creatinine, Ser: 0.86 mg/dL (ref 0.50–1.10)
Creatinine, Ser: 0.87 mg/dL (ref 0.50–1.10)
GFR calc Af Amer: >90 mL/min (ref >90)
GFR calc non Af Amer: 86 mL/min — ABNORMAL LOW (ref >90)
Glucose, Bld: 112 mg/dL — ABNORMAL HIGH (ref 70–99)
Potassium: 3.2 mEq/L — ABNORMAL LOW (ref 3.5–5.1)

## 2013-05-15 MED ORDER — CIPROFLOXACIN HCL 500 MG PO TABS: 500.0000 mg | ORAL_TABLET | Freq: Two times a day (BID) | ORAL | Status: AC

## 2013-05-15 MED ORDER — POTASSIUM CHLORIDE CRYS ER 10 MEQ PO TBCR: 10.0000 meq | EXTENDED_RELEASE_TABLET | Freq: Two times a day (BID) | ORAL | Status: AC

## 2013-05-15 MED ORDER — POTASSIUM CHLORIDE CRYS ER 10 MEQ PO TBCR
10.0000 meq | EXTENDED_RELEASE_TABLET | Freq: Three times a day (TID) | ORAL | Status: DC
  Administered 2013-05-15: 10 meq via ORAL
  Filled 2013-05-15 (×3): qty 1

## 2013-05-15 NOTE — Clinical Social Work Note (Signed)
Patient medically stable for discharge back to [place] skilled nursing facility today. CSW compiled medical packet and facilitated transport back to SNF via ambulance.  Genelle Bal, MSW, LCSW [phone]

## 2013-05-15 NOTE — Progress Notes (Signed)
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    PT Comments   Pt continues to be a high fall risk due to her impulsiveness and balance deficits. Pt required min guard to min (A) when challenged with high level balance activities. Pt unsteady when attempting to ambulate without AD. Will cont to follow per POC. Per CSW note pt plans to D/C to SNF. Pt will need 24/7 supervision for safety, unsure if family can provide this for pt. Discharge disposition updated.   Follow Up Recommendations  Home health PT;SNF;Other (comment);Supervision/Assistance - 24 hour (per CSW note pt plans to D/C to SNF )     Does the patient have the potential to tolerate intense rehabilitation     Barriers to Discharge        Equipment Recommendations  Rolling walker with 5" wheels    Recommendations for Other Services OT consult  Frequency Min 3X/week   Progress towards PT Goals Progress towards PT goals: Progressing toward goals  Plan Discharge plan needs to be updated    Precautions / Restrictions Precautions Precautions: Fall Restrictions Weight Bearing Restrictions: No   Pertinent Vitals/Pain No c/o pain.     Mobility  Bed Mobility Bed Mobility: Supine to Sit;Sitting - Scoot to Edge of Bed Supine to Sit: 6: Modified independent (Device/Increase time);HOB elevated;With rails Sitting - Scoot to Edge of Bed: 7: Independent Details for Bed Mobility Assistance: pt relied heavily on handrails; required incr time to complete supine to sit; no physical (A) needed  today Transfers Transfers: Sit to Stand;Stand to Sit Sit to Stand: 4: Min assist;From bed;With upper extremity assist Stand to Sit: 4: Min assist;To chair/3-in-1;With armrests;With upper extremity assist Details for Transfer Assistance: performed sit to stand x2 from bed; pt unsteady and demo LOB posteriorly; cues and (A) for balance and to control descent; pt demo decr safety awareness with transfers  Ambulation/Gait Ambulation/Gait Assistance: 4: Min guard Ambulation Distance (Feet): 250 Feet Assistive device: Rolling walker Ambulation/Gait Assistance Details: pt with knees flexed during ambulation; min guard to steady and for safety; tends to push RW out anteriorly and lean forward; cues for safety and technique; pt slightly unsteady  Gait Pattern: Step-through pattern;Trunk flexed Gait velocity: decreased; when she attempted to increase -- she was more unsteady  General Gait Details: attempted ambulating without RW within room; pt unsteady and reaching for external objects; benefits from RW to reduce risk of falls  Stairs: No Wheelchair Mobility Wheelchair Mobility: No    Exercises General Exercises - Lower Extremity Long Arc Quad: AROM;Both;10 reps;Seated;Other (comment) (max cues to stay on task ) Hip Flexion/Marching: AROM;Both;10 reps;Standing;Other (comment) (required UE support to maintain balance )   PT Diagnosis:    PT Problem List:   PT Treatment Interventions:     PT Goals (current goals can now be found in the care plan section) Acute Rehab PT Goals Patient Stated Goal: none stated PT Goal Formulation: Patient unable to participate in goal setting Time For Goal Achievement: 05/27/13 Potential to Achieve Goals: Good  Visit Information  Last PT Received On: 05/15/13 Assistance Needed: +1 History of Present Illness: Patient with  h/o Limbic Encephalitis diagnosed at [facility], 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     Subjective Data  Subjective: Pt lying supine; agreeable to therapy.  Patient Stated Goal: none stated   Cognition  Cognition Arousal/Alertness: Awake/alert Behavior During Therapy: Impulsive Overall Cognitive Status: Impaired/Different from baseline Area of Impairment: Attention;Following commands;Safety/judgement;Problem solving Current Attention Level: Selective Memory: Decreased short-term memory Following Commands: Follows one step commands with increased time Safety/Judgement: Decreased awareness of safety;Decreased awareness of deficits Problem Solving: Slow processing;Decreased initiation;Difficulty sequencing;Requires verbal cues;Requires tactile cues General Comments: pt continues to be impulsive and does not always follow commands and cues for safety     Balance  Balance Balance Assessed: Yes Static Sitting Balance Static Sitting - Balance Support: No upper extremity supported;Feet supported Static Sitting - Level of Assistance: 7: Independent High Level Balance High Level Balance Activites: Backward walking;Direction changes;Head turns High Level Balance Comments: pt unsteady when challenged with high level; cues for safety and management of RW; LOB with backward ambulation; requried (A) to recover   End of Session PT - End of Session Equipment Utilized During Treatment: Gait belt Activity Tolerance: Patient tolerated treatment well Patient left: in chair;with call bell/phone within reach;with chair alarm set Nurse Communication: Mobility status   GP     Donell Sievert, [place] [phone] 05/15/2013, 9:14 AM

## 2013-05-15 NOTE — Discharge Summary (Addendum)
Physician Discharge Summary  [name] Greene NWG:[id number] DOB: 1975/08/12 DOA: 05/09/2013  PCP: Terald Sleeper, MD  Admit date: 05/09/2013 Discharge date: 05/15/2013  Time spent: greater than 30 minutes  Recommendations for Outpatient Follow-up:  1. Push fluids 2. Check BMET within a week  Discharge Diagnoses:  Active Problems:   Limbic encephalitis   Seizures   E. 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   Hospital Course:  Initially, patient was admitted to ICU under critical care service. She was started on hypotonic IV fluid. She was initially started on ceftriaxone, but her cultures showed Escherichia coli resistant to ceftriaxone. She was transitioned to Cipro. She will receive a five-day course. Currently she is back to her clinical baseline. She speaks occasionally but mostly communicates via nodding yes and no. She is feeding herself. She does work with physical therapy who recommends transfer back to skilled nursing facility due to ongoing balance issues and impulsiveness. Her serum sodium is normal. Please continue to push oral fluids. She has been hypokalemic and has been receiving repletion. Will recommend continuing potassium supplementation and repeat a basic metabolic panel in a week.  Procedures:  None  Consultations:    Discharge Exam: Filed Vitals:   05/15/13 0851  BP: 148/101  Pulse: 120  Temp: 99.6 F (37.6 C)  Resp: 19    General: In chair.  Smiling and watching TV. He answered yes and no today for the first time that I have examined her. Cardiovascular: Regular rate rhythm without murmurs gallops rubs.  Respiratory: Clear to auscultation bilaterally without wheezes rhonchi or rales Abdomen soft nontender nondistended Extremities no clubbing cyanosis or edema.  Discharge Instructions  Discharge Orders   Future Orders Complete By Expires   Diet general  As directed    Discharge instructions  As directed    Comments:     Push fluids. Check BMET in one week   Walk with assistance  As directed        Medication List         acetaminophen 650 MG suppository  Commonly known as:  TYLENOL  Place 650 mg rectally every 6 (six) hours as needed for fever. For tempeture     azaTHIOprine 50 MG tablet  Commonly known as:  IMURAN  Take 150 mg by mouth daily.     cholecalciferol 1000 UNITS tablet  Commonly known as:  VITAMIN D  Take 1,000 Units by mouth daily.     ciprofloxacin 500 MG tablet  Commonly known as:  CIPRO  Take 1 tablet (500 mg total) by mouth 2 (two) times daily. Through 12/16, then stop.     docusate sodium 100 MG capsule  Commonly known as:  COLACE  Take 100 mg by mouth 2 (two) times daily.     ferrous sulfate 325 (65 FE) MG tablet  Take 325 mg by mouth daily with breakfast.     levETIRAcetam 750 MG tablet  Commonly known as:  KEPPRA  Take 1 tablet (750 mg total)  by mouth every 12 (twelve) hours.     MILK OF MAGNESIA PO  Take 30 mLs by mouth daily as needed (constipation).     polyethylene glycol packet  Commonly known as:  MIRALAX / GLYCOLAX  Take 17 g by mouth 3 (three) times daily.     potassium chloride 10 MEQ tablet  Commonly known as:  K-DUR,KLOR-CON  Take 1 tablet (10 mEq total) by mouth 2 (two) times daily.     prednisoLONE 15 MG/5ML solution  Commonly known as:  ORAPRED  Take 40 mg by mouth daily before breakfast.     senna 8.6 MG tablet  Commonly known as:  SENOKOT  Take 2 tablets  by mouth daily.     zolpidem 5 MG tablet  Commonly known as:  AMBIEN  Take 5 mg by mouth 2 (two) times daily.       No Known Allergies    The results of significant diagnostics from this hospitalization (including imaging, microbiology, ancillary and laboratory) are listed below for reference.    Significant Diagnostic Studies: Dg Chest Port 1 View  05/10/2013   CLINICAL DATA:  Shortness of breath.  EXAM: PORTABLE CHEST - 1 VIEW  COMPARISON:  05/09/2013 and 10/20/2012  FINDINGS: Cardiomegaly, unchanged. Pulmonary vascularity is normal. Lungs are clear. No effusions. Severe thoracolumbar scoliosis.  IMPRESSION: No acute abnormality.   Electronically Signed   By: Geanie Cooley M.D.   On: 05/10/2013 07:20   Dg Chest Port 1 View  05/09/2013   CLINICAL DATA:  Evaluate for airspace disease, hydrocephalus, seizure disorder  EXAM: PORTABLE CHEST - 1 VIEW  COMPARISON:  10/20/2012  FINDINGS: Chronic S-shaped scoliosis of the thoracic and lumbar spine. Mild cardiac enlargement. Lungs remain clear. No focal pneumonia, collapse or consolidation. No effusion or pneumothorax. No acute osseous finding.  IMPRESSION: Cardiomegaly without acute process.  Stable exam.   Electronically Signed   By: Ruel Favors M.D.   On: 05/09/2013 17:13    Microbiology: Recent Results (from the past 240 hour(s))  URINE CULTURE     Status: None   Collection Time    05/09/13  1:51 PM      Result Value Range Status   Specimen Description URINE, CATHETERIZED   Final   Special Requests NONE   Final   Culture  Setup Time     Final   Value: 05/09/2013 20:35     Performed at [facility] Count     Final   Value: >=100,000 COLONIES/ML     Performed at [facility]   Culture     Final   Value: ESCHERICHIA COLI     Note: Two isolates with different morphologies were identified as the same organism.The most resistant organism was reported.     Performed at [facility]   Report Status  05/12/2013 FINAL   Final   Organism ID, Bacteria ESCHERICHIA COLI   Final  MRSA PCR SCREENING     Status: None   Collection Time    05/09/13  9:21 PM      Result Value Range Status   MRSA by PCR NEGATIVE  NEGATIVE Final   Comment:            The GeneXpert MRSA Assay (FDA     approved for NASAL specimens     only), is one component of a     comprehensive MRSA colonization     surveillance program. It is not  intended to diagnose MRSA     infection nor to guide or     monitor treatment for     MRSA infections.     Labs: Basic Metabolic Panel:  Recent Labs Lab 05/09/13 1308 05/09/13 2010  05/13/13 0633 05/13/13 1003 05/14/13 0600 05/15/13 0547 05/15/13 0948  NA 172* 171*  < > 146* 143 148* 147* 144  K 3.2* 3.4*  < > 3.2* 4.7 3.8 3.2* 3.5  CL >130* >130*  < > 110 108 113* 108 106  CO2 29 28  < > 26 22 25 26 25   GLUCOSE 104* 80  < > 113* 120* 106* 112* 188*  BUN 29* 25*  < > 7 6 10 10 10   CREATININE 1.22* 1.21*  < > 0.78 0.75 0.79 0.86 0.87  CALCIUM 9.4 8.6  < > 8.5 8.2* 8.5 8.7 8.6  MG  --  2.7*  --   --   --   --   --   --   PHOS  --  2.8  --   --   --   --   --   --   < > = values in this interval not displayed. Liver Function Tests: No results found for this basename: AST, ALT, ALKPHOS, BILITOT, PROT, ALBUMIN,  in the last 168 hours No results found for this basename: LIPASE, AMYLASE,  in the last 168 hours No results found for this basename: AMMONIA,  in the last 168 hours CBC:  Recent Labs Lab 05/09/13 1308 05/10/13 0509 05/11/13 0241 05/12/13 0516  WBC 12.2* 9.2 8.0 6.2  NEUTROABS 9.6*  --   --   --   HGB 15.1* 12.3 11.5* 11.9*  HCT 47.4* 39.8 36.4 36.2  MCV 104.6* 105.3* 102.5* 100.0  PLT 175 139* 129* 123*   Cardiac Enzymes: No results found for this basename: CKTOTAL, CKMB, CKMBINDEX, TROPONINI,  in the last 168 hours BNP: BNP (last 3 results) No results found for this basename: PROBNP,  in the last 8760 hours CBG:  Recent Labs Lab  05/12/13 1140 05/12/13 1630 05/12/13 1956 05/12/13 2346 05/13/13 0356  GLUCAP 121* 167* 139* 130* 125*     Signed:  Andria Head L  [facility] 05/15/2013, 11:15 AM

## 2013-05-16 ENCOUNTER — Other Ambulatory Visit: Payer: Self-pay | Admitting: *Deleted

## 2013-05-16 ENCOUNTER — Non-Acute Institutional Stay (SKILLED_NURSING_FACILITY): Payer: Medicaid Other | Admitting: Internal Medicine

## 2013-05-16 DIAGNOSIS — A498 Other bacterial infections of unspecified site: Secondary | ICD-10-CM

## 2013-05-16 DIAGNOSIS — N390 Urinary tract infection, site not specified: Secondary | ICD-10-CM

## 2013-05-16 DIAGNOSIS — B9620 Unspecified Escherichia coli [E. coli] as the cause of diseases classified elsewhere: Secondary | ICD-10-CM

## 2013-05-16 DIAGNOSIS — R569 Unspecified convulsions: Secondary | ICD-10-CM

## 2013-05-16 DIAGNOSIS — G0490 Encephalitis and encephalomyelitis, unspecified: Secondary | ICD-10-CM

## 2013-05-16 DIAGNOSIS — E870 Hyperosmolality and hypernatremia: Secondary | ICD-10-CM

## 2013-05-16 MED ORDER — ZOLPIDEM TARTRATE 5 MG PO TABS: ORAL_TABLET | ORAL | Status: AC

## 2013-05-16 NOTE — Progress Notes (Signed)
Patient ID: [name] Greene, female   DOB: Sep 04, 1975, 37 y.o.   MRN: [id number]  Chief complaint ; readmission to the facility post stay at [facility] December 9 through December 15  History; this is a 37 year old woman, who we admitted to the facility in December 2013. She was post an extremely complex stay at [facility] ultimately diagnosed with limbic encephalitis. She was also seen by psychiatry for depression and  apparent catatonia. She came here on a complex medical regimen, which included azathioprine, prednisone at 60 mg, and Ambien 10 mg every 6 hours routinely used as "off label" treatment for catatonia. She has recently been back to see her neurologist at [facility] in April and he has outlined a tapering course of prednisone to 40 mg. The azathioprine has been increased to 150 mg daily.. Lab work was done at the physician's office. She was also noted to have hypernatremia last checked here on October 30 her sodium was 143  The patient has generally shown some good improvement  Her PEG tube was removed in March. She is eating and drinking on her own. She is able to verbalize, talks to her mother on the phone. Staff report that she actually seems somewhat sedated with the Ambien. I don't believe she has ever had any psychiatric followup at [facility]. The last note from her neurologist was from April 24 at which time the patient was tapered somewhat on her prednisone to 40 mg daily. Her azathioprine was increased to 150 mg daily last CBC on November 17 showed a white count of 11.7 hemoglobin 13.4 differential, the white count was normal and her platelet count was also normal recent basic metabolic panel on October 30 was normal with a sodium of 143 BUN of 11 creatinine of 0.75. I had thought that she was going to have an MRI of the brain although I don't know that that was ever done, nor am I am I precisely sure when her next followup is with neurology. Her last appointment with neurology at [facility] was in November  however I have no information on this. I am uncertain whether a followup MRI it was suggested in April was ever done  The patient was admitted the hospital after her day history of declining oral intake, change in status was reported. Lab work done to workup this showed a sodium of 167. She was sent the hospital where her sodium was 172. She was noted to the ICU for initial fluid replacement. A urine culture grew Escherichia coli. She was placed on Rocephin over the Escherichia coli was resistant and she was put on quinolones. I see urine specific gravity at 1.23. She was also hypokalemic. She returns to the facility where she appears to be doing fairly well. Staff report an adequate oral intake she appears to be drinking and her vital signs are stable.    Recent Labs Lab  05/09/13 1308  05/09/13 2010    05/13/13 0633  05/13/13 1003  05/14/13 0600  05/15/13 0547  05/15/13 0948   NA  172*  171*   < >  146*  143  148*  147*  144   K  3.2*  3.4*   < >  3.2*  4.7  3.8  3.2*  3.5   CL  >130*  >130*   < >  110  108  113*  108  106   CO2  29  28   < >  26  22  25   26  25   GLUCOSE  104*  80   < >  113*  120*  106*  112*  188*   BUN  29*  25*   < >  7  6  10  10  10    CREATININE  1.22*  1.21*   < >  0.78  0.75  0.79  0.86  0.87   CALCIUM  9.4  8.6   < >  8.5  8.2*  8.5  8.7  8.6   MG   --   2.7*   --    --    --    --    --    --    PHOS   --   2.8   --    --    --    --    --    --    < > = values in this interval not displayed. Liver Function Tests: No results found for this basename: AST, ALT, ALKPHOS, BILITOT, PROT, ALBUMIN,  in the last 168 hours No results found for this basename: LIPASE, AMYLASE,  in the last 168 hours No results found for this basename: AMMONIA,  in the last 168 hours CBC:   Recent Labs Lab  05/09/13 1308  05/10/13 0509  05/11/13 0241  05/12/13 0516   WBC  12.2*  9.2  8.0  6.2   NEUTROABS  9.6*   --    --    --    HGB  15.1*  12.3  11.5*  11.9*   HCT  47.4*   39.8  36.4  36.2   MCV  104.6*  105.3*  102.5*  100.0   PLT  175  139*  129*  123*    Cardiac Enzymes: No results found for this basename: CKTOTAL, CKMB, CKMBINDEX, TROPONINI,  in the last 168 hours BNP: BNP (last 3 results) No results found for this basename: PROBNP,  in the last 8760 hours CBG:   Recent Labs Lab  05/12/13 1140  05/12/13 1630  05/12/13 1956  05/12/13 2346  05/13/13 0356   GLUCAP  121*  167*  139*  130*  125*      Past Medical History  Diagnosis Date  . Hydrocephalus   . Muscle weakness (generalized)   . Seizures   . Hypertension   . Limbic encephalitis 02/16/2013  . Anemia, unspecified 02/16/2013  . Catatonia 02/16/2013    medication; azithromycin 150 daily, vitamin D 1000 units daily, Cipro 500 twice a day through 12/16, Colace 100 twice a day, ferrous sulfate 325 daily, Keppra 750 every 12, Klor-Con 10 milliequivalents twice a day, prednisone 40 mg daily, Senokot 2 tablets daily, Ambien 5 mg twice a day   Physical exam. respiratory rate 18, pulse 110 O2 sat 97 Gen. tears to be back to her usual status. She does not speak to me but waves and follows my commands. She can stand with assistance HEENT; mucous membranes are moist.question soft right sided goiter  Lymphadenopathy; nonpalpable no cervical clavicular or axillary area Respiratory clear bilaterally. Cardiac heart sounds are normal. No murmurs. She is dehydrated Musculoskeletal she continues to have her extension at the PIP of both hands. I wonder some point if she had active synovitis, although she has not had any currently. This is not new Neurologic; cranial nerves pupils are small but react, extraocular movements seem intact. She appears to have some right-sided facial weakness. I don't see any new focal neurologic weakness. She  is able to move both her legs and both her arms reflexes seem intact. Left plantar is extensor right is equivocal. No Hoffmans reflects bilaterally. Her right side is weaker  than the left although she does have functional use. Is able to stand with assist Mental status;  as mentioned she seems completely back to her baseline  Impression/plan #1 limbic encephalitis with severe neurologic compromise. The patient has actually done fairly well over the last year until recently he was functional walking with a walker able to feed herself. She has remained on prednisone 40 mg and Imuran 150. follows with you when seen around  #2 history of catatonic posture. I have never seen this in her however it is the reason she is on Ambien being used off label for this. This was prescribed by [facility] psychiatry. At one point several months ago stopped reported that she was more listless I. therefore reduce the Ambien to 5 mg twice a day. By my view she has not had any ill effect from this and she continues to be stable .really see no useful reason to continue the Ambien however her family became very irate with me last week over this issue I'll hold off further reduction for now  #3 severe hypernatremia; this has resolved with fluid resuscitate. The specific gravity seems appropriately concentrated therefore I don't think there is any reason to think this is a fluid handling issue .I suppose it is useful to blame this on her suppose that UTI however I am not completely certain that that's the case. We will monitor carefully her oral intake. She spends weeks at home with her family we'll also need to monitor her intake and the situation  #4 seizure disorder controlled on Keppra no recent seizures #5 Escherichia coli UTI/pyelonephritis. Whether this was the etiology of her decline I am uncertain #6 question soft right sided goiter she was not easy to examine. I'm going to make sure her thyroid function has been checked. #7 some degree of steroid-induced hyperglycemia which will also need to be monitored    complicated patient. I'll phone the hospitalist to see if we can get her in the hospital to  correct the hypernatremia among other issues. Hopefully she'll respond to this without a more complicated hospital course. I discussed this in detail with her family including her mother is at the bedside and her brother who was in [place]

## 2013-05-23 ENCOUNTER — Non-Acute Institutional Stay (SKILLED_NURSING_FACILITY): Payer: Medicaid Other | Admitting: Internal Medicine

## 2013-05-23 DIAGNOSIS — E870 Hyperosmolality and hypernatremia: Secondary | ICD-10-CM

## 2013-05-23 DIAGNOSIS — A498 Other bacterial infections of unspecified site: Secondary | ICD-10-CM

## 2013-05-23 DIAGNOSIS — B9620 Unspecified Escherichia coli [E. coli] as the cause of diseases classified elsewhere: Secondary | ICD-10-CM

## 2013-05-23 DIAGNOSIS — N390 Urinary tract infection, site not specified: Secondary | ICD-10-CM

## 2013-05-23 NOTE — Progress Notes (Signed)
Patient ID: [name] Greene, female   DOB: 01/16/1976, 37 y.o.   MRN: [id number] Facility; [facility]  Chief complaint ; hypernatremia with a sodium of 150  History this is a 37 year old woman who was recently admitted to the hospital for treatment of a sodium of the 172. In spite of the serum sodium she was still conscious and had normal vital signs. She was discovered also to have an Escherichia coli UTI treated with Rocephin do to quinolone resistance. I have reviewed [facility] link, the only culture I actually see shows sensitivity of the Escherichia coli to quinolones therefore am not certain where I got this information in my H&P from December 16. In any case I was handed lab from yesterday which sohowed a sodium of 150, BUN and creatinine were normal. White count 5 hemoglobin 10.7 hemoglobin A1c was 6 TSH was normal. she was actually out of the facility on her way back to [place] for the holidays with her family. I called him back to review this.  Review of systems; not possible from the patient however staff report that she variably drinks but then will not drink at other times. She has not been to my knowledge catatonic however the staff told me that she does seem to exhibit catatonic-like episodes.  Physical exam Respiratory clear entry bilaterally Cardiac heart sounds are normal she does not look clinically dehydrated at the bedside Abdomen no tenderness no masses GU no suprapubic but there is intense left costovertebral angle tenderness  Impression/plan #1 hypernatremia; in spite of my concern about this the family is still going to take her home to [place]. They state they can push fluids just as well there is we can here. My concern was the lack of followup however they state they will monitor her and take her to the hospital as necessary.  #2 question recurrent UTI based on the left costovertebral angle tenderness. I have had him do an in and out cath on her and  will empirically give her ciprofloxacin #3 catatonia?Marland Kitchen The nurse told me that she was catatonic and came in for my review of this earlier this morning. She was easily arousable to voice localize the sound therefore I don't think this episode was catatonic at. Nevertheless I will be more vigilant about this. She had been put on Ambien on a fairly routine basis during the day as an off label treatment for catatonia. I had been reducing the Ambien as I was getting reports of drowsiness however all need to review this when she is back on the facility and in the new year  I have expressed my but concerns to the patient's mother and brother who are present however they have elected to take her out of the facility for the holidays in spite of this.

## 2013-06-06 ENCOUNTER — Non-Acute Institutional Stay (SKILLED_NURSING_FACILITY): Payer: 59 | Admitting: Internal Medicine

## 2013-06-06 DIAGNOSIS — E870 Hyperosmolality and hypernatremia: Secondary | ICD-10-CM

## 2013-06-06 DIAGNOSIS — G0490 Encephalitis and encephalomyelitis, unspecified: Secondary | ICD-10-CM

## 2013-06-06 DIAGNOSIS — G0491 Myelitis, unspecified: Secondary | ICD-10-CM

## 2013-06-15 NOTE — Progress Notes (Signed)
Patient ID: [name] Greene, female   DOB: 11/24/1975, 38 y.o.   MRN: [id number]               PROGRESS NOTE  DATE:  06/06/2013    [facility]     LEVEL OF CARE:   SNF   Acute Visit/Discharge Visit     CHIEF COMPLAINT:  Pre-discharge review.    HISTORY OF PRESENT ILLNESS:  This is a 38 year-old woman whom we admitted to the facility in December of 2013.  Prior to this, she had an extremely complex stay at [facility], ultimately diagnosed with limbic encephalitis.  She also had apparent problems with depression and catatonic posturing.  She came here on azathioprine and prednisone which had been adjusted by [facility].  We have been able to taper her Ambien from q.6 to twice a day (was on this off-label for catatonic posturing).    The patient has really done quite well.  She had her PEG tube removed earlier this year.  She is ambulatory; able to eat, drink, and apparently toilet herself.     She had a recent hospitalization for extreme hypernatremia, which occurred from May 09, 2013 through May 15, 2013.  She had previously been at her mother's house, then back into the facility, and obviously was not drinking properly.  This required intravenous fluid resuscitation.  She actually has been at her mother's house again over the holidays and returned today at my insistence for lab work to include a basic metabolic panel, CBC and differential.    CURRENT MEDICATIONS:  Discharge medications will include:    Imuran 150, three times daily.    Vitamin D 1000 U daily.    Ferrous sulfate 325 daily.    Keppra 750 q.12.    MiraLAX 17 g three times daily.    Potassium chloride 1 tablet two times daily.    Prednisolone 15 mg/5 mL, 40 mg p.o. daily before breakfast.    Senna 8.6, 2 tablets daily.     Ambien 5 mg p.o. b.i.d.    PHYSICAL EXAMINATION:   GENERAL APPEARANCE:  The patient appears to be well.  She is awake, alert.  Does not appear to be grossly  dehydrated.   CHEST/RESPIRATORY:  Clear air entry bilaterally.   CARDIOVASCULAR:  CARDIAC:   Heart sounds are normal.  Once again, she does not appear to be dehydrated at bedside.    ASSESSMENT/PLAN:  Limbic encephalitis.  This patient has done well here.  Will be discharged on her prednisone 40 mg daily and Imuran 150 mg daily.    Recent problems with hypernatremia.  I have rechecked this, along with a CBC and differential.    CPT CODE: [id number] (well over 30 minutes spent in discussion with patient and her family and staff members in the facility)

## 2014-01-18 IMAGING — CR DG CHEST 1V PORT
1 series · 1 of 1 positions shown · non-contrast
Comparison: None.

CLINICAL DATA: Altered mental status.  Non responsive.

PORTABLE CHEST - 1 VIEW

[AP]
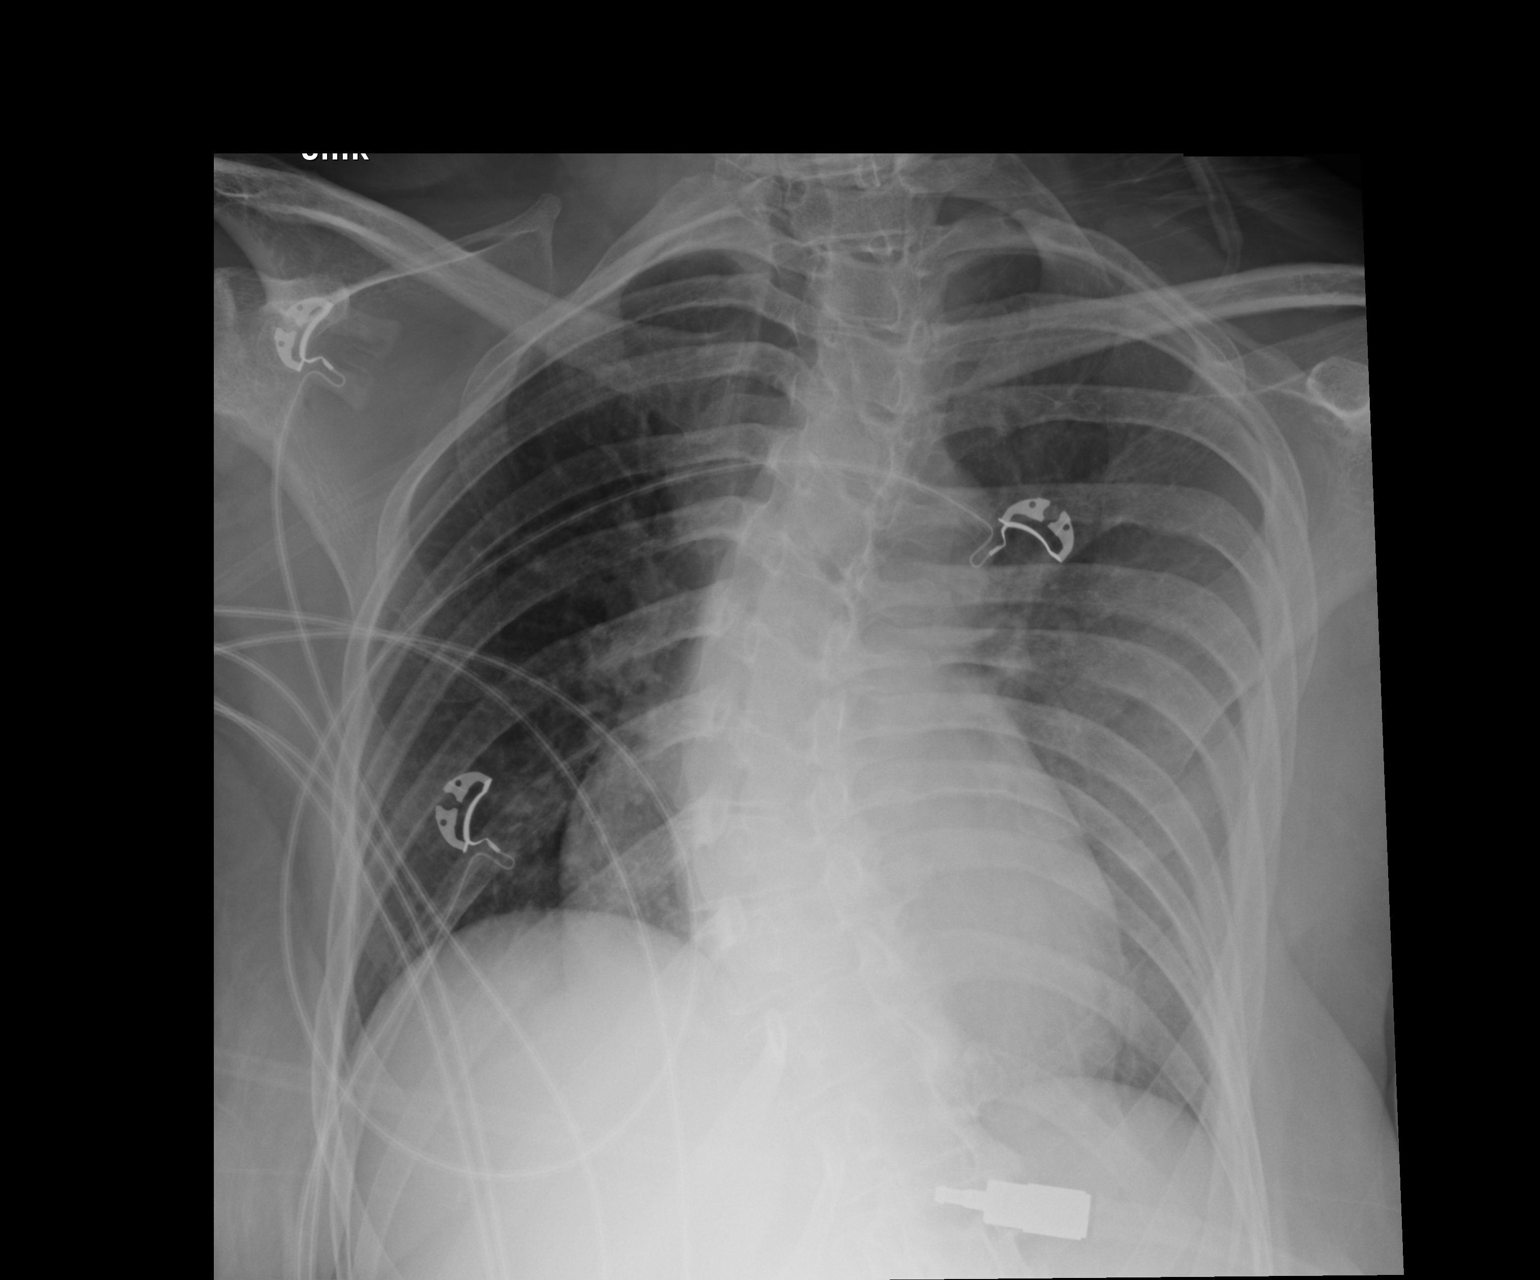

[1 of 1 positions shown; findings below may reference images not displayed]

FINDINGS: Thoracolumbar scoliosis.  Shallow inspiration.  Mild
prominence of heart size may be due to technique.  Pulmonary
vascularity is normal.  No focal airspace consolidation in the
lungs.  No blunting of costophrenic angles.  No pneumothorax.
Mediastinal contours appear intact.
IMPRESSION: No evidence of active pulmonary disease.

## 2014-05-09 IMAGING — CT CT HEAD W/O CM
1 of 2 series · 13 of 30 positions shown, 17 images · non-contrast
Comparison: 07/02/2012

CLINICAL DATA: Seizure activity

CT HEAD WITHOUT CONTRAST
TECHNIQUE: Contiguous axial images were obtained from the base of
the skull through the vertex without contrast.

[Series 2: brain · axial · 0.47mm/px · z∈[+134,+255]mm · 13 of 28 slices shown, 17 images]
[im 2/28  brain]
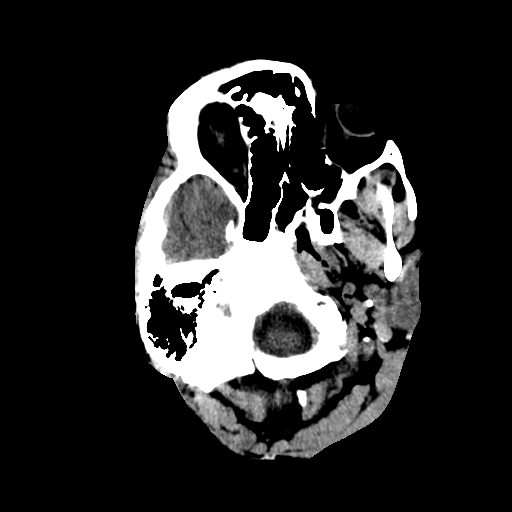
[im 2/28  bone]
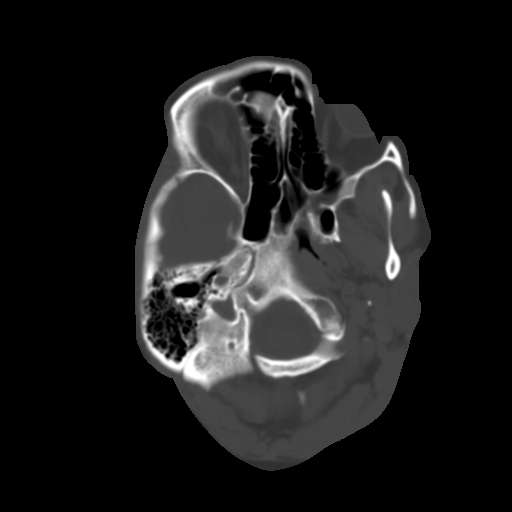
[im 4/28  brain]
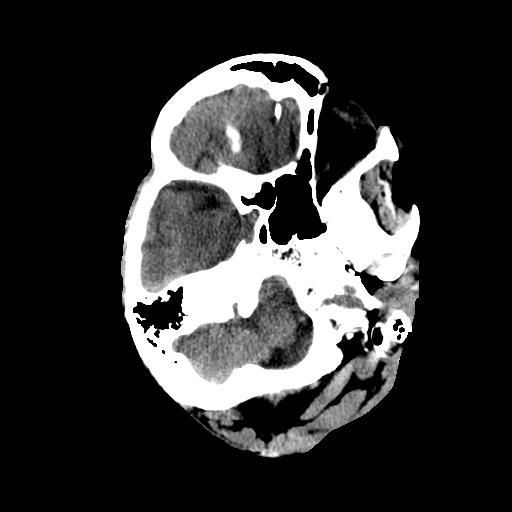
[im 6/28  brain]
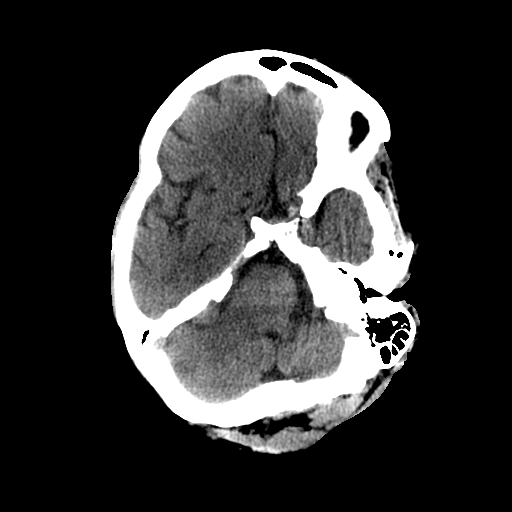
[im 8/28  brain]
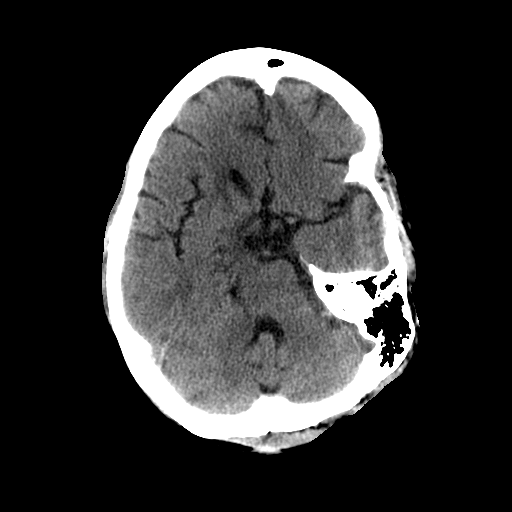
[im 10/28  brain]
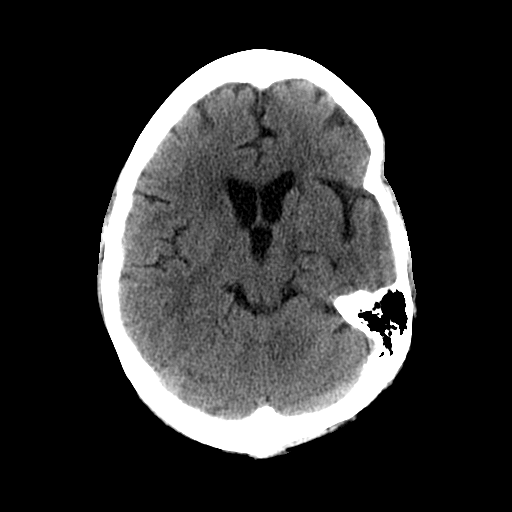
[im 10/28  bone]
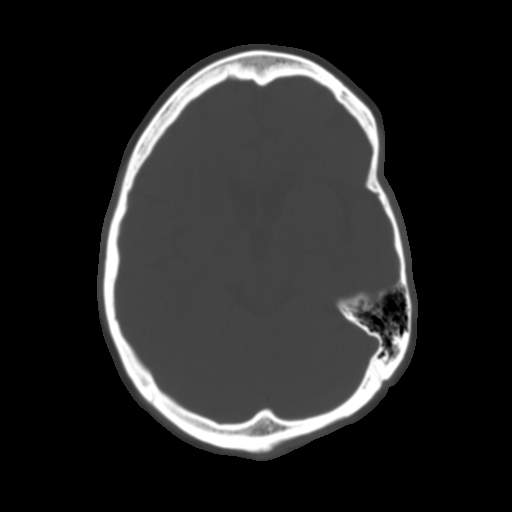
[im 12/28  brain]
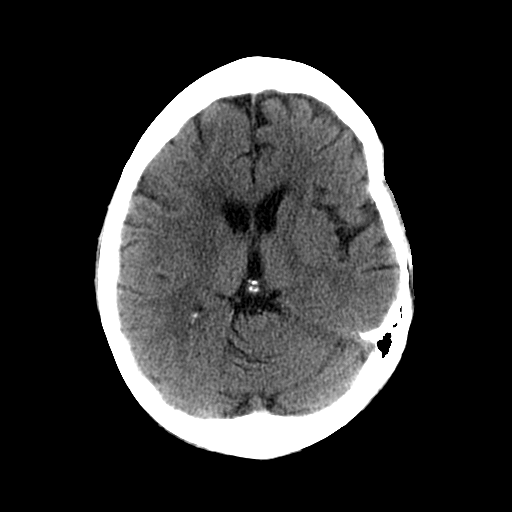
[im 14/28  brain]
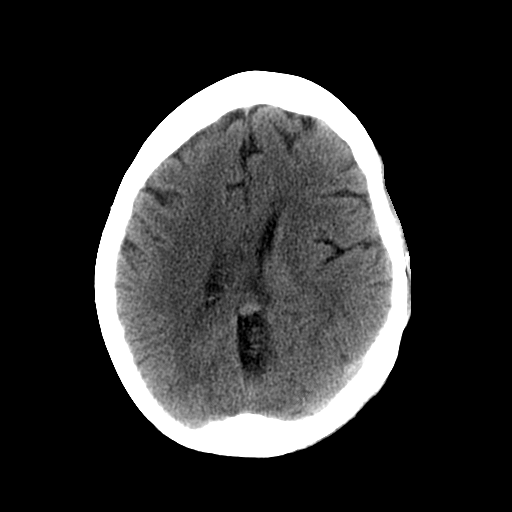
[im 16/28  brain]
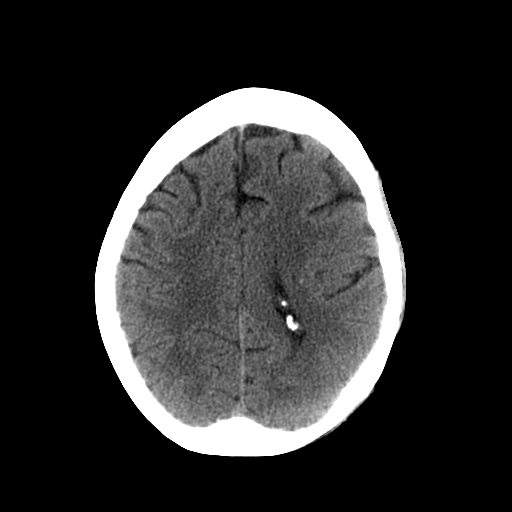
[im 18/28  brain]
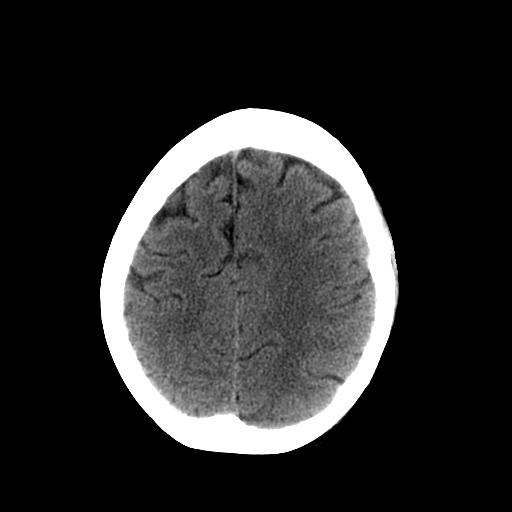
[im 18/28  bone]
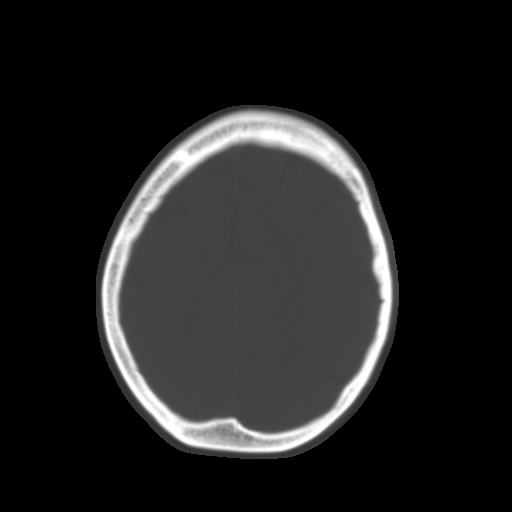
[im 20/28  brain]
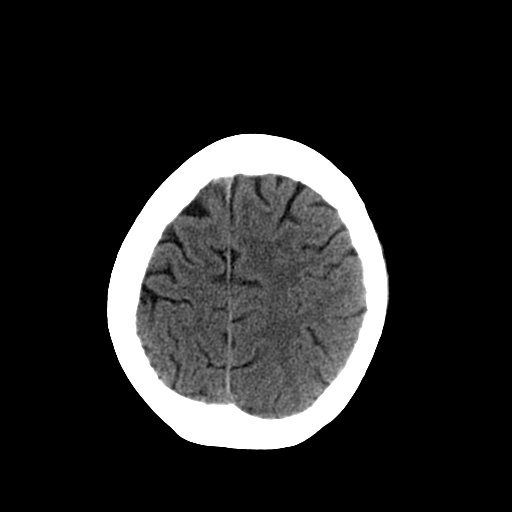
[im 22/28  brain]
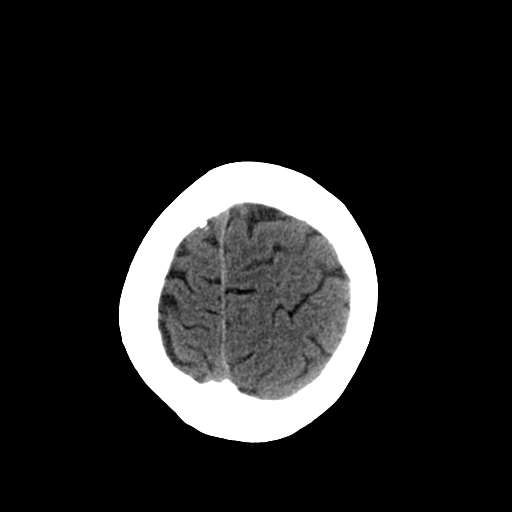
[im 24/28  brain]
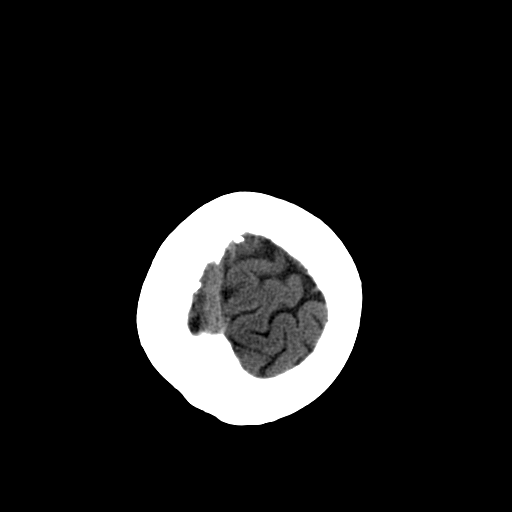
[im 26/28  brain]
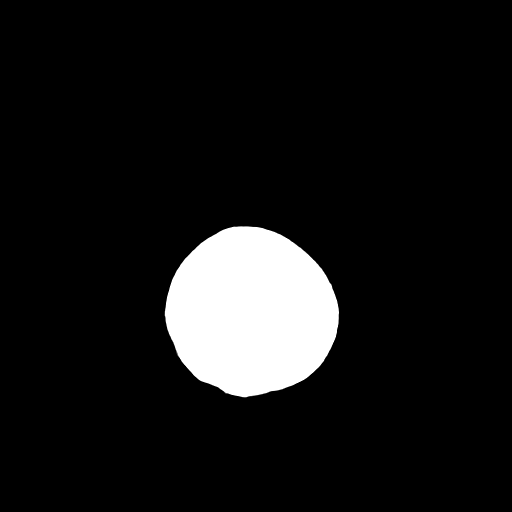
[im 26/28  bone]
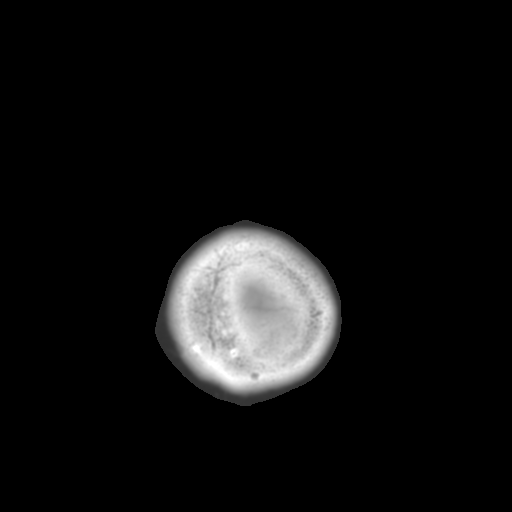

[13 of 30 positions shown; findings below may reference images not displayed]

FINDINGS: There is mild low attenuation within the subcortical and
periventricular white matter consistent with chronic small vessel
ischemic change.

There is mild prominence of the sulci and ventricles consistent
with brain atrophy.

There is no evidence for acute brain infarct, hemorrhage or mass.

The paranasal sinuses and mastoid air cells are clear.  The skull
is intact.
IMPRESSION: 1.  No acute intracranial abnormalities.
2.  Small vessel ischemic disease and brain atrophy.

## 2014-11-26 IMAGING — DX DG CHEST 1V PORT
1 series · 1 of 1 positions shown · non-contrast
Comparison: 10/20/2012

CLINICAL DATA: Evaluate for airspace disease, hydrocephalus,
seizure disorder

EXAM:
PORTABLE CHEST - 1 VIEW

[portable]
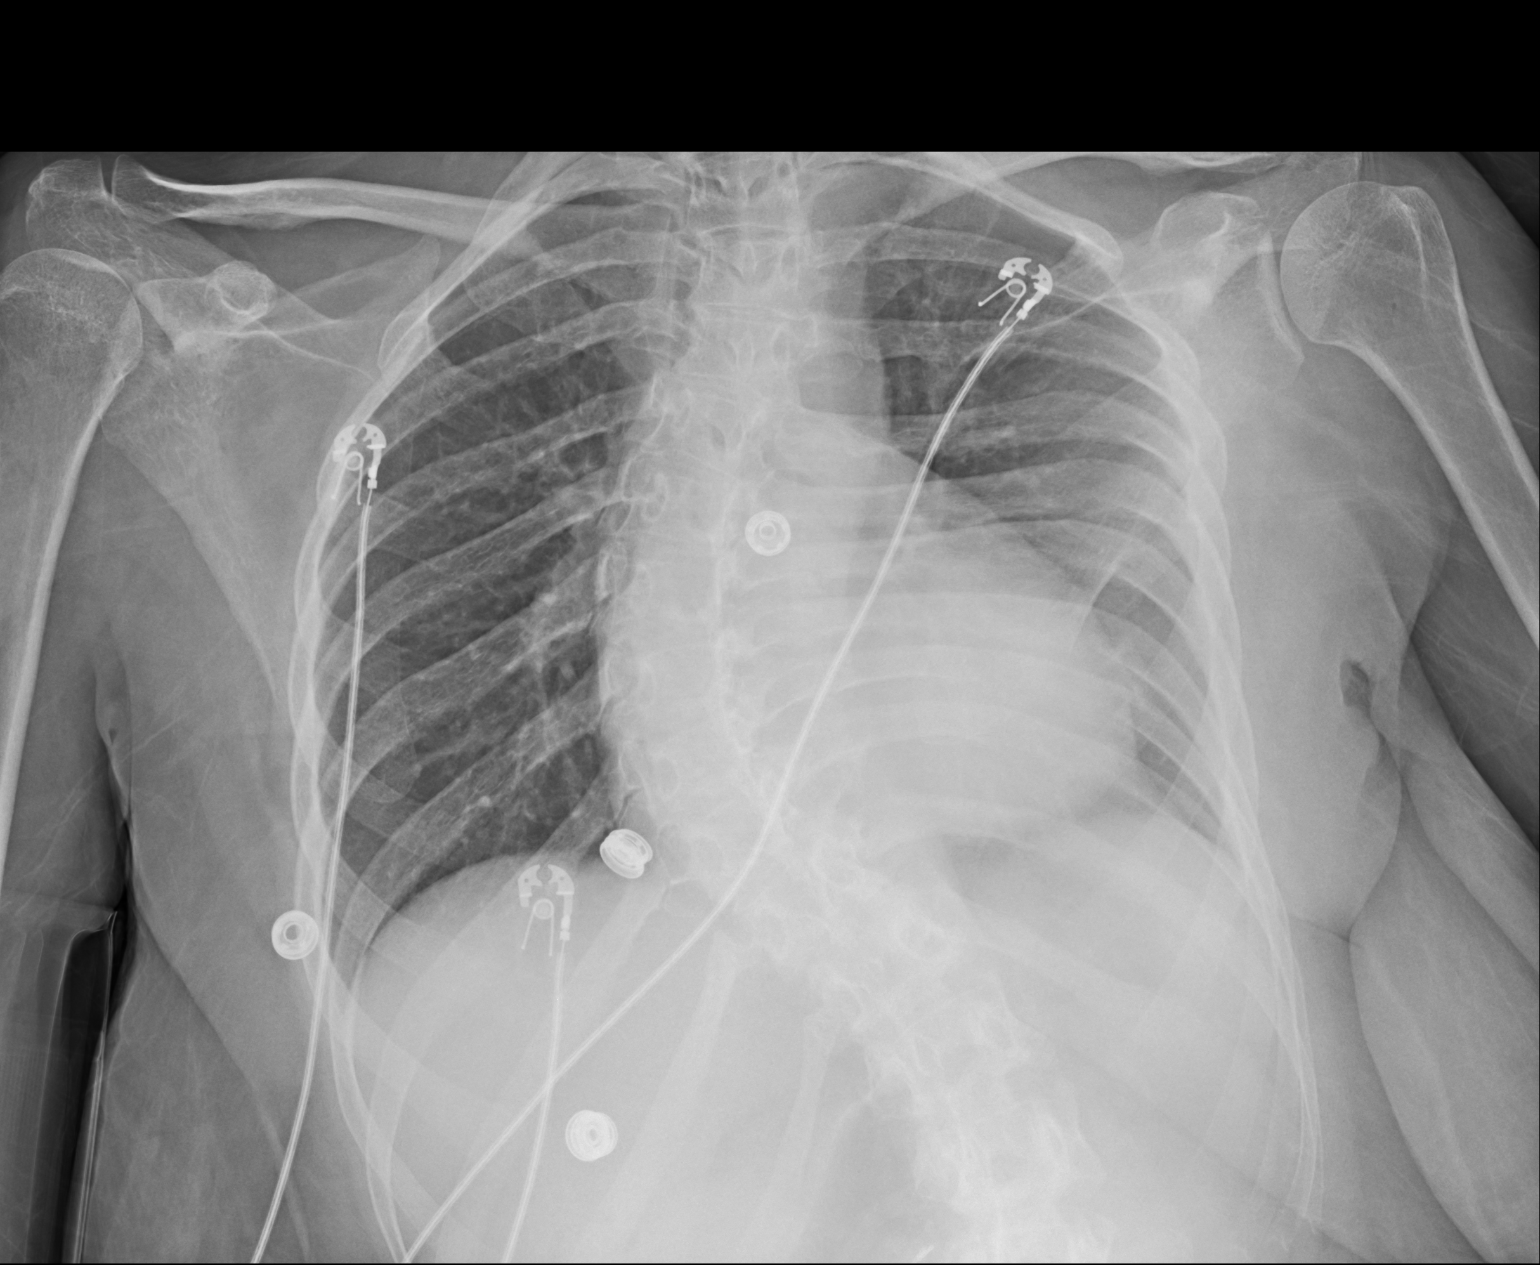

[1 of 1 positions shown; findings below may reference images not displayed]

FINDINGS: Chronic S-shaped scoliosis of the thoracic and lumbar spine. Mild
cardiac enlargement. Lungs remain clear. No focal pneumonia,
collapse or consolidation. No effusion or pneumothorax. No acute
osseous finding.
IMPRESSION: Cardiomegaly without acute process.  Stable exam.

## 2014-11-27 IMAGING — DX DG CHEST 1V PORT
1 series · 1 of 1 positions shown · non-contrast
Comparison: 05/09/2013 and 10/20/2012

CLINICAL DATA: Shortness of breath.

EXAM:
PORTABLE CHEST - 1 VIEW

[portable]
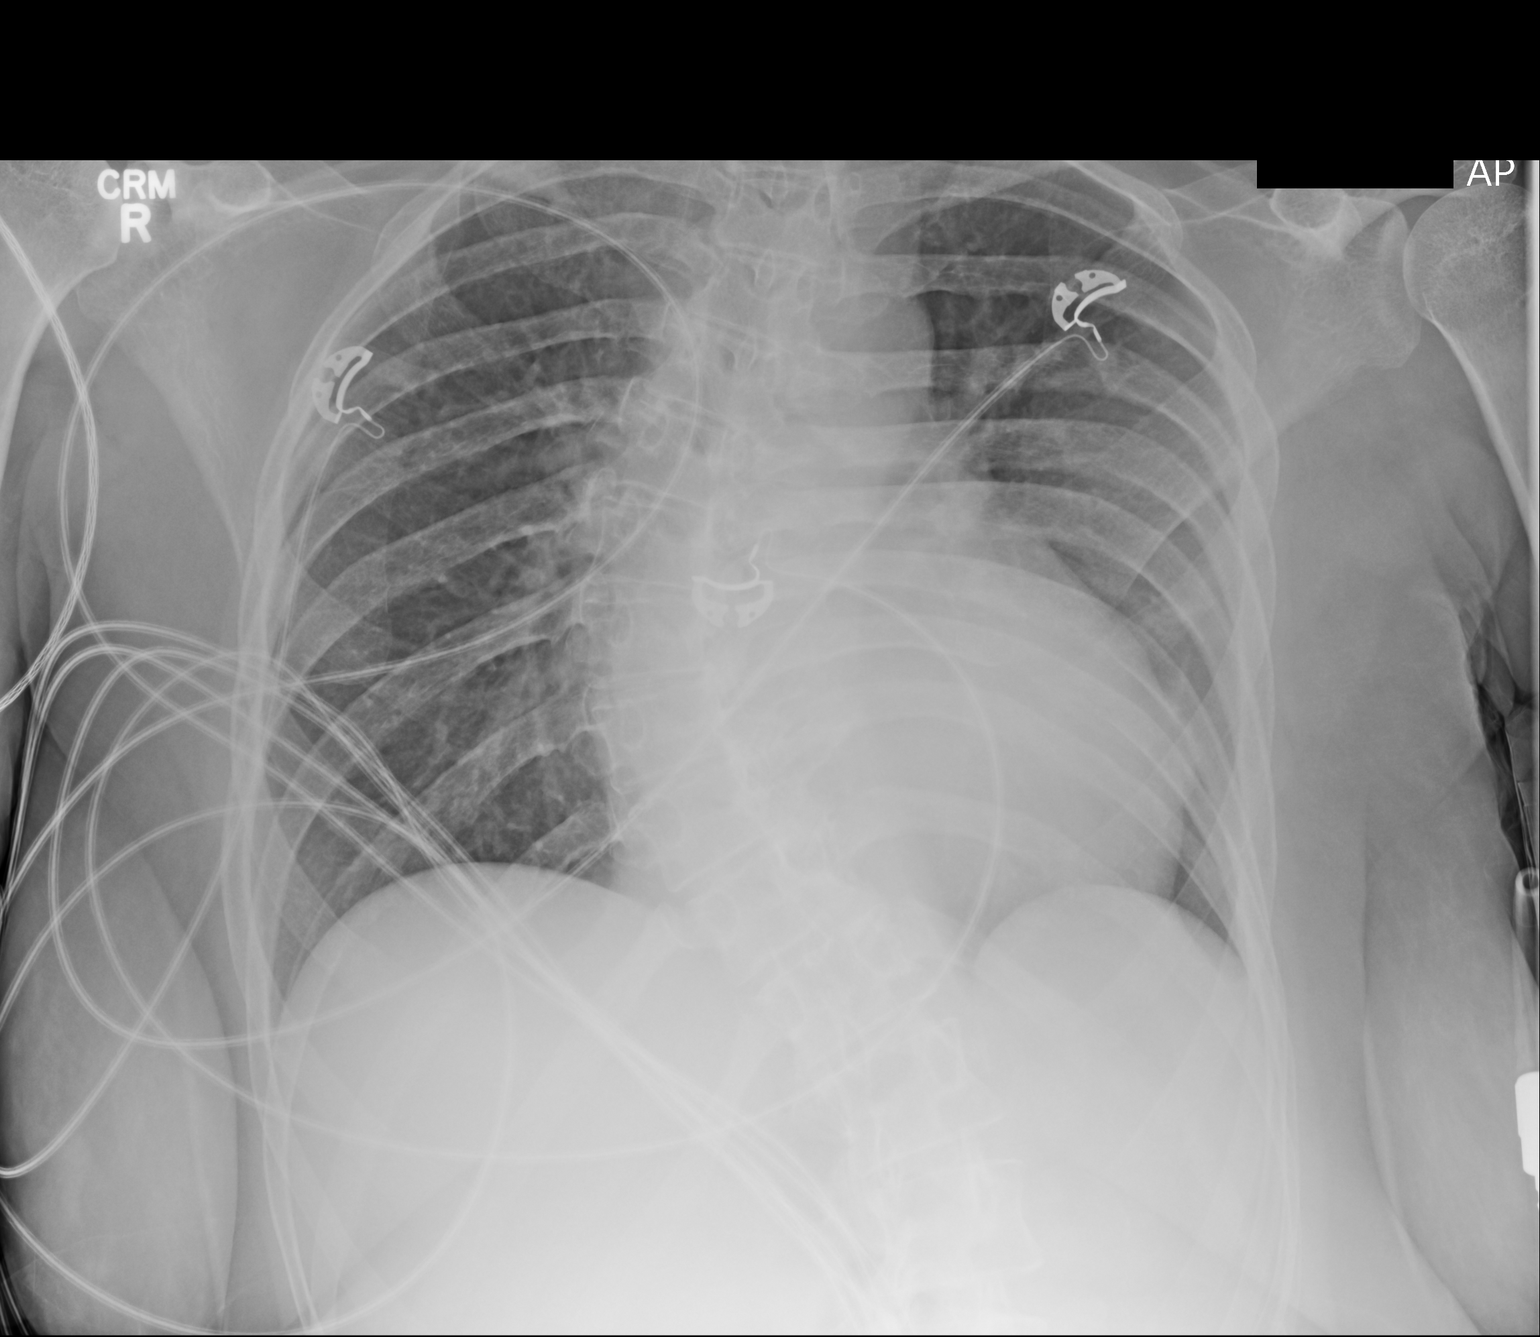

[1 of 1 positions shown; findings below may reference images not displayed]

FINDINGS: Cardiomegaly, unchanged. Pulmonary vascularity is normal. Lungs are
clear. No effusions. Severe thoracolumbar scoliosis.
IMPRESSION: No acute abnormality.

## 2020-05-01 DEATH — deceased
